# Patient Record
Sex: Female | Born: 1965 | Race: White | Hispanic: No | Marital: Married | Smoking: Former smoker
Health system: Southern US, Community
[De-identification: ages and names within clinical notes are randomized; demographics above are authoritative.]

## PROBLEM LIST (undated history)

## (undated) DIAGNOSIS — F1010 Alcohol abuse, uncomplicated: Secondary | ICD-10-CM

## (undated) DIAGNOSIS — Z5189 Encounter for other specified aftercare: Secondary | ICD-10-CM

## (undated) DIAGNOSIS — F419 Anxiety disorder, unspecified: Secondary | ICD-10-CM

## (undated) DIAGNOSIS — E0590 Thyrotoxicosis, unspecified without thyrotoxic crisis or storm: Secondary | ICD-10-CM

## (undated) DIAGNOSIS — E038 Other specified hypothyroidism: Secondary | ICD-10-CM

## (undated) DIAGNOSIS — E785 Hyperlipidemia, unspecified: Secondary | ICD-10-CM

## (undated) DIAGNOSIS — R011 Cardiac murmur, unspecified: Secondary | ICD-10-CM

## (undated) DIAGNOSIS — D649 Anemia, unspecified: Secondary | ICD-10-CM

## (undated) DIAGNOSIS — F32A Depression, unspecified: Secondary | ICD-10-CM

## (undated) DIAGNOSIS — F329 Major depressive disorder, single episode, unspecified: Secondary | ICD-10-CM

## (undated) HISTORY — DX: Cardiac murmur, unspecified: R01.1

## (undated) HISTORY — PX: WISDOM TOOTH EXTRACTION: SHX21

## (undated) HISTORY — DX: Hyperlipidemia, unspecified: E78.5

## (undated) HISTORY — DX: Major depressive disorder, single episode, unspecified: F32.9

## (undated) HISTORY — PX: ECTOPIC PREGNANCY SURGERY: SHX613

## (undated) HISTORY — DX: Depression, unspecified: F32.A

## (undated) HISTORY — DX: Encounter for other specified aftercare: Z51.89

## (undated) HISTORY — DX: Anxiety disorder, unspecified: F41.9

## (undated) HISTORY — DX: Alcohol abuse, uncomplicated: F10.10

## (undated) HISTORY — DX: Anemia, unspecified: D64.9

## (undated) HISTORY — PX: TUBAL LIGATION: SHX77

---

## 1997-06-20 ENCOUNTER — Other Ambulatory Visit: Admission: RE | Admit: 1997-06-20 | Discharge: 1997-06-20 | Payer: Self-pay | Admitting: Obstetrics and Gynecology

## 1997-10-28 ENCOUNTER — Inpatient Hospital Stay (HOSPITAL_COMMUNITY): Admission: AD | Admit: 1997-10-28 | Discharge: 1997-10-28 | Payer: Self-pay | Admitting: Obstetrics and Gynecology

## 1997-11-21 ENCOUNTER — Encounter (HOSPITAL_COMMUNITY): Admission: RE | Admit: 1997-11-21 | Discharge: 1997-12-30 | Payer: Self-pay | Admitting: Obstetrics and Gynecology

## 1997-12-09 ENCOUNTER — Inpatient Hospital Stay (HOSPITAL_COMMUNITY): Admission: AD | Admit: 1997-12-09 | Discharge: 1997-12-09 | Payer: Self-pay | Admitting: Obstetrics and Gynecology

## 1997-12-10 ENCOUNTER — Ambulatory Visit (HOSPITAL_COMMUNITY): Admission: RE | Admit: 1997-12-10 | Discharge: 1997-12-10 | Payer: Self-pay | Admitting: Obstetrics and Gynecology

## 1997-12-10 ENCOUNTER — Encounter: Payer: Self-pay | Admitting: Obstetrics and Gynecology

## 1997-12-12 ENCOUNTER — Inpatient Hospital Stay (HOSPITAL_COMMUNITY): Admission: AD | Admit: 1997-12-12 | Discharge: 1997-12-12 | Payer: Self-pay | Admitting: Obstetrics & Gynecology

## 1997-12-26 ENCOUNTER — Inpatient Hospital Stay (HOSPITAL_COMMUNITY): Admission: AD | Admit: 1997-12-26 | Discharge: 1997-12-29 | Payer: Self-pay | Admitting: Obstetrics and Gynecology

## 1998-01-28 ENCOUNTER — Other Ambulatory Visit: Admission: RE | Admit: 1998-01-28 | Discharge: 1998-01-28 | Payer: Self-pay | Admitting: Obstetrics and Gynecology

## 1999-02-10 ENCOUNTER — Ambulatory Visit (HOSPITAL_COMMUNITY): Admission: RE | Admit: 1999-02-10 | Discharge: 1999-02-10 | Payer: Self-pay | Admitting: Obstetrics and Gynecology

## 1999-03-26 ENCOUNTER — Other Ambulatory Visit: Admission: RE | Admit: 1999-03-26 | Discharge: 1999-03-26 | Payer: Self-pay | Admitting: Obstetrics and Gynecology

## 2003-05-09 ENCOUNTER — Emergency Department (HOSPITAL_COMMUNITY): Admission: EM | Admit: 2003-05-09 | Discharge: 2003-05-09 | Payer: Self-pay | Admitting: Emergency Medicine

## 2004-05-08 ENCOUNTER — Other Ambulatory Visit: Admission: RE | Admit: 2004-05-08 | Discharge: 2004-05-08 | Payer: Self-pay | Admitting: Obstetrics and Gynecology

## 2004-05-25 ENCOUNTER — Encounter: Admission: RE | Admit: 2004-05-25 | Discharge: 2004-05-25 | Payer: Self-pay | Admitting: Obstetrics and Gynecology

## 2004-12-18 ENCOUNTER — Encounter: Admission: RE | Admit: 2004-12-18 | Discharge: 2004-12-18 | Payer: Self-pay | Admitting: Obstetrics and Gynecology

## 2005-06-07 ENCOUNTER — Encounter: Admission: RE | Admit: 2005-06-07 | Discharge: 2005-06-07 | Payer: Self-pay | Admitting: Obstetrics and Gynecology

## 2010-04-12 ENCOUNTER — Encounter: Payer: Self-pay | Admitting: Obstetrics and Gynecology

## 2010-06-19 ENCOUNTER — Other Ambulatory Visit: Payer: Self-pay | Admitting: Endocrinology

## 2010-06-19 DIAGNOSIS — R1011 Right upper quadrant pain: Secondary | ICD-10-CM

## 2010-06-22 ENCOUNTER — Other Ambulatory Visit: Payer: Self-pay

## 2010-06-24 ENCOUNTER — Ambulatory Visit
Admission: RE | Admit: 2010-06-24 | Discharge: 2010-06-24 | Disposition: A | Discharge status: Home / Self Care | Payer: BC Managed Care – PPO | Source: Ambulatory Visit | Attending: Endocrinology | Admitting: Endocrinology

## 2010-06-24 DIAGNOSIS — R1011 Right upper quadrant pain: Secondary | ICD-10-CM

## 2010-06-24 MED ORDER — IOHEXOL 300 MG/ML  SOLN
100.0000 mL | Freq: Once | INTRAMUSCULAR | Status: AC | PRN
  Administered 2010-06-24: 100 mL via INTRAVENOUS

## 2011-10-01 ENCOUNTER — Encounter: Payer: Self-pay | Admitting: Gastroenterology

## 2011-11-01 ENCOUNTER — Encounter: Payer: Self-pay | Admitting: Gastroenterology

## 2011-11-01 ENCOUNTER — Ambulatory Visit (INDEPENDENT_AMBULATORY_CARE_PROVIDER_SITE_OTHER): Payer: BC Managed Care – PPO | Admitting: Gastroenterology

## 2011-11-01 ENCOUNTER — Other Ambulatory Visit (INDEPENDENT_AMBULATORY_CARE_PROVIDER_SITE_OTHER): Payer: BC Managed Care – PPO

## 2011-11-01 VITALS — BP 146/84 | HR 100 | Ht 61.0 in | Wt 155.6 lb

## 2011-11-01 DIAGNOSIS — K625 Hemorrhage of anus and rectum: Secondary | ICD-10-CM

## 2011-11-01 DIAGNOSIS — R1011 Right upper quadrant pain: Secondary | ICD-10-CM

## 2011-11-01 MED ORDER — MOVIPREP 100 G PO SOLR: 1.0000 | ORAL | Status: DC

## 2011-11-01 NOTE — Progress Notes (Signed)
HPI: This is a   very pleasant 46 year old woman whom I am meeting for the first time today.   With intermittent rectal bleeding, 1.5 years, more frequently lately.  At least once a week now. With BMs.  USually at end of BM, frank blood.  Tissue.  No anal pain, no itching.  No real constipatoin.    Overall stable weight.  No abd pains.  No colon cancer.  Can have brisk gastro-colic reflex.  Also has inttermittent RUQ abd pain. [place] in PCP office was normal from she tells me. CT scan in 2012 abd/pelvis was normal.   She tells that her liver tests all within normal primary care office  She is to drink about 12-24 beers a day, this has cut back down to 6-8 beers a day fairly regularly.   Review of systems: Pertinent positive and negative review of systems were noted in the above HPI section. Complete review of systems was performed and was otherwise normal.    Past Medical History  Diagnosis Date  . Depression   . Alcohol abuse   . Hyperlipidemia   . Hypothyroidism     Past Surgical History  Procedure Date  . Tubal ligation   . Ectopic pregnancy surgery     Current Outpatient Prescriptions  Medication Sig Dispense Refill  . DULoxetine (CYMBALTA) 60 MG capsule Take 60 mg by mouth daily.      Marland Kitchen levothyroxine (SYNTHROID, LEVOTHROID) 175 MCG tablet Take 175 mcg by mouth daily.      . rosuvastatin (CRESTOR) 20 MG tablet Take 20 mg by mouth daily.        Allergies as of 11/01/2011 - Review Complete 11/01/2011  Allergen Reaction Noted  . Codeine  11/01/2011    Family History  Problem Relation Age of Onset  . Colon polyps Father   . Heart disease Father   . Breast cancer Sister   . Crohn's disease Sister   . Ovarian cancer Maternal Aunt   . Diabetes Maternal Grandmother   . Diabetes Maternal Grandfather   . Diabetes Paternal Grandmother   . Diabetes Paternal Grandfather   . Thyroid disease Mother     History   Social History  . Marital Status: Married    Spouse Name:  N/A    Number of Children: 3  . Years of Education: N/A   Occupational History  . Customer Service    Social History Main Topics  . Smoking status: Former Games developer  . Smokeless tobacco: Never Used  . Alcohol Use: Yes  . Drug Use: No  . Sexually Active: Not on file   Other Topics Concern  . Not on file   Social History Narrative  . No narrative on file       Physical Exam: BP 146/84  Pulse 100  Ht 5\' 1"  (1.549 m)  Wt 155 lb 9.6 oz (70.58 kg)  BMI 29.40 kg/m2  LMP 11/01/2011 Constitutional: generally well-appearing Psychiatric: alert and oriented x3 Eyes: extraocular movements intact Mouth: oral pharynx moist, no lesions Neck: supple no lymphadenopathy Cardiovascular: heart regular rate and rhythm Lungs: clear to auscultation bilaterally Abdomen: soft, nontender, nondistended, no obvious ascites, no peritoneal signs, normal bowel sounds Extremities: no lower extremity edema bilaterally Skin: no lesions on visible extremities Rectal examination with female assistant in the room: Medium sized external anal hemorrhoids without any thrombosis, no internal masses, stool is brown and not check for Hemoccult blood   Assessment and plan: 46 y.o. female with  external hemorrhoids, rectal  bleeding, intermittent abdominal pains, high alcohol intake daily  We need to proceed with colonoscopy to make sure that nothing else is missed, contributing to her intermittent rectal bleeding such as rectal tumors, sigmoid or other colon cancers. She drinks quite a lot of alcohol and recommended she try to cut back even further. Suspicious that she might have alcohol related gastritis as the source of her intermittent abdominal pains are likely with EGD at the same time as her colonoscopy. These should be done with propofol sedation given her alcohol abuse.

## 2011-11-01 NOTE — Patient Instructions (Addendum)
You will have labs checked today in the basement lab.  Please head down after you check out with the front desk  (cbc). You will be set up for a colonoscopy for rectal bleeding with MAC sedation given high alcohol intake. You will be set up for an upper endoscopy for RUQ pains at same time as colonoscopy. You probably drink too much alcohol daily, should try cutting back even more.

## 2011-11-02 ENCOUNTER — Encounter: Payer: Self-pay | Admitting: Gastroenterology

## 2011-11-02 LAB — CBC WITH DIFFERENTIAL/PLATELET
Basophils Absolute: 0.1 10*3/uL (ref 0.0–0.1)
Eosinophils Absolute: 0.1 10*3/uL (ref 0.0–0.7)
Eosinophils Relative: 0.8 % (ref 0.0–5.0)
HCT: 41 % (ref 36.0–46.0)
Lymphs Abs: 1.5 10*3/uL (ref 0.7–4.0)
MCV: 89.5 fl (ref 78.0–100.0)
Monocytes Absolute: 0.7 10*3/uL (ref 0.1–1.0)
Neutrophils Relative %: 71.6 % (ref 43.0–77.0)
Platelets: 290 10*3/uL (ref 150.0–400.0)
RDW: 14.5 % (ref 11.5–14.6)
WBC: 8.2 10*3/uL (ref 4.5–10.5)

## 2011-11-03 ENCOUNTER — Telehealth: Payer: Self-pay | Admitting: Gastroenterology

## 2011-11-04 MED ORDER — PRAMOXINE-HC 1-2.5 % EX CREA: TOPICAL_CREAM | CUTANEOUS | Status: DC

## 2011-11-04 NOTE — Telephone Encounter (Signed)
Patient reports that her hemorrhoids are now bothering her itching and burning.  Can we send in some anusol cream or suppositories?

## 2011-11-04 NOTE — Telephone Encounter (Signed)
Yes, analpram ointment, apply twice daily as needed.  Disp one month with 2 refills.  If she is not already on fiber supplements she should be taking them daily.  Thanks

## 2011-11-04 NOTE — Telephone Encounter (Signed)
Patient advised.

## 2011-11-12 ENCOUNTER — Encounter: Payer: Self-pay | Admitting: Gastroenterology

## 2011-11-12 ENCOUNTER — Ambulatory Visit (AMBULATORY_SURGERY_CENTER): Payer: BC Managed Care – PPO | Admitting: Gastroenterology

## 2011-11-12 VITALS — BP 152/76 | HR 104 | Temp 98.7°F | Resp 20 | Ht 61.0 in | Wt 155.0 lb

## 2011-11-12 DIAGNOSIS — K2970 Gastritis, unspecified, without bleeding: Secondary | ICD-10-CM

## 2011-11-12 DIAGNOSIS — R1011 Right upper quadrant pain: Secondary | ICD-10-CM

## 2011-11-12 DIAGNOSIS — K625 Hemorrhage of anus and rectum: Secondary | ICD-10-CM

## 2011-11-12 DIAGNOSIS — K2990 Gastroduodenitis, unspecified, without bleeding: Secondary | ICD-10-CM

## 2011-11-12 DIAGNOSIS — K649 Unspecified hemorrhoids: Secondary | ICD-10-CM

## 2011-11-12 DIAGNOSIS — D126 Benign neoplasm of colon, unspecified: Secondary | ICD-10-CM

## 2011-11-12 MED ORDER — SODIUM CHLORIDE 0.9 % IV SOLN: 500.0000 mL | INTRAVENOUS | Status: DC

## 2011-11-12 MED ORDER — HYDROCORTISONE ACETATE 25 MG RE SUPP: 25.0000 mg | Freq: Two times a day (BID) | RECTAL | Status: AC | PRN

## 2011-11-12 NOTE — Patient Instructions (Addendum)
Discharge instructions given with verbal understanding. Handouts on polyps and hemorrhoids given. Resume previous medications. YOU HAD AN ENDOSCOPIC PROCEDURE TODAY AT THE [facility]: Refer to the procedure report that was given to you for any specific questions about what was found during the examination.  If the procedure report does not answer your questions, please call your gastroenterologist to clarify.  If you requested that your care partner not be given the details of your procedure findings, then the procedure report has been included in a sealed envelope for you to review at your convenience later.  YOU SHOULD EXPECT: Some feelings of bloating in the abdomen. Passage of more gas than usual.  Walking can help get rid of the air that was put into your GI tract during the procedure and reduce the bloating. If you had a lower endoscopy (such as a colonoscopy or flexible sigmoidoscopy) you may notice spotting of blood in your stool or on the toilet paper. If you underwent a bowel prep for your procedure, then you may not have a normal bowel movement for a few days.  DIET: Your first meal following the procedure should be a light meal and then it is ok to progress to your normal diet.  A half-sandwich or bowl of soup is an example of a good first meal.  Heavy or fried foods are harder to digest and may make you feel nauseous or bloated.  Likewise meals heavy in dairy and vegetables can cause extra gas to form and this can also increase the bloating.  Drink plenty of fluids but you should avoid alcoholic beverages for 24 hours.  ACTIVITY: Your care partner should take you home directly after the procedure.  You should plan to take it easy, moving slowly for the rest of the day.  You can resume normal activity the day after the procedure however you should NOT DRIVE or use heavy machinery for 24 hours (because of the sedation medicines used during the test).    SYMPTOMS TO REPORT  IMMEDIATELY: A gastroenterologist can be reached at any hour.  During normal business hours, 8:30 AM to 5:00 PM Monday through Friday, call [phone].  After hours and on weekends, please call the GI answering service at [phone] who will take a message and have the physician on call contact you.   Following lower endoscopy (colonoscopy or flexible sigmoidoscopy):  Excessive amounts of blood in the stool  Significant tenderness or worsening of abdominal pains  Swelling of the abdomen that is new, acute  Fever of 100F or higher  Following upper endoscopy (EGD)  Vomiting of blood or coffee ground material  New chest pain or pain under the shoulder blades  Painful or persistently difficult swallowing  New shortness of breath  Fever of 100F or higher  Black, tarry-looking stools  FOLLOW UP: If any biopsies were taken you will be contacted by phone or by letter within the next 1-3 weeks.  Call your gastroenterologist if you have not heard about the biopsies in 3 weeks.  Our staff will call the home number listed on your records the next business day following your procedure to check on you and address any questions or concerns that you may have at that time regarding the information given to you following your procedure. This is a courtesy call and so if there is no answer at the home number and we have not heard from you through the emergency physician on call, we will assume that you   have returned to your regular daily activities without incident.  SIGNATURES/CONFIDENTIALITY: You and/or your care partner have signed paperwork which will be entered into your electronic medical record.  These signatures attest to the fact that that the information above on your After Visit Summary has been reviewed and is understood.  Full responsibility of the confidentiality of this discharge information lies with you and/or your care-partner.  

## 2011-11-12 NOTE — Op Note (Signed)
[facility] [address] [place], [address]   COLONOSCOPY PROCEDURE REPORT  PATIENT: Jill Olsen, Jill Olsen  MR#: [id number] BIRTHDATE: Apr 17, 1965 , 45  yrs. old GENDER: Female ENDOSCOPIST: Rachael Fee, MD REFERRED [id number] Evlyn Kanner, M.D. PROCEDURE DATE:  11/12/2011 PROCEDURE:   Colonoscopy with snare polypectomy ASA CLASS:   Class II INDICATIONS:minor rectal bleeding. MEDICATIONS: propofol (Diprivan) 300mg  IV  DESCRIPTION OF PROCEDURE:   After the risks benefits and alternatives of the procedure were thoroughly explained, informed consent was obtained.  A digital rectal exam revealed no rectal mass.   The Pentax Ped Colon [id number]  endoscope was introduced through the anus and advanced to the cecum, which was identified by both the appendix and ileocecal valve. No adverse events experienced.   The quality of the prep was good.  The instrument was then slowly withdrawn as the colon was fully examined.    COLON FINDINGS: A smooth sessile polyp ranging between 3-55mm in size was found in the sigmoid colon.  A polypectomy was performed using snare cautery.  The resection was complete and the polyp tissue was completely retrieved.   External hemorrhoids were found.   The colon mucosa was otherwise normal.  Retroflexed views revealed no abnormalities. The time to cecum=2 minutes 37 seconds  Withdrawal time=9 minutes 58 seconds.  The scope was withdrawn and the procedure completed.  COMPLICATIONS: There were no complications. ENDOSCOPIC IMPRESSION: 1.   Single small polyp was found, removed and sent to pathology 2.   External hemorrhoids 3.   The colon mucosa was otherwise normal  RECOMMENDATIONS: If the polyp(s) removed today are proven to be adenomatous (pre-cancerous) polyps, you will need a repeat colonoscopy in 5 years.  Otherwise you should continue to follow colorectal cancer screening guidelines for "routine risk" patients with colonoscopy in 10 years.   You will receive a letter within 1-2 weeks with the results of your biopsy as well as final recommendations.  Please call my office if you have not received a letter after 3 weeks.  eSigned:  Rachael Fee, MD 11/12/2011 10:17 AM

## 2011-11-12 NOTE — Op Note (Signed)
[facility] [address] [place], [address]   ENDOSCOPY PROCEDURE REPORT  PATIENT: Jill Olsen, Jill Olsen  MR#: [id number] BIRTHDATE: November 26, 1965 , 45  yrs. old GENDER: Female ENDOSCOPIST: Rachael Fee, MD REFERRED BY:  Adrian Prince, M.D. PROCEDURE DATE:  11/12/2011 PROCEDURE:  EGD w/ biopsy ASA CLASS:     Class II INDICATIONS:  abdominal pain in the upper right quadrant. MEDICATIONS: Propofol (Diprivan) 170 mg IV TOPICAL ANESTHETIC: none  DESCRIPTION OF PROCEDURE: After the risks benefits and alternatives of the procedure were thoroughly explained, informed consent was obtained.  The [id number] ([id number]) endoscope was introduced through the mouth and advanced to the second portion of the duodenum. Without limitations.  The instrument was slowly withdrawn as the mucosa was fully examined.     Moderate gastritis (inflammation) was found in the gastric antrum. A biopsy was performed using cold forceps.  The remainder of the upper endoscopy exam was otherwise normal.  Retroflexed views revealed no abnormalities.     The scope was then withdrawn from the patient and the procedure completed.  COMPLICATIONS: There were no complications.  ENDOSCOPIC IMPRESSION: 1.   Moderate gastritis was found and biopsied to check for H. pylori.  Alcohol can cause similar finding 2.   The remainder of the upper endoscopy exam was otherwise normal  RECOMMENDATIONS: Await biopsy results Continue to try to cut back on alcohol, it is probably causing or at least contributing to your GI symptoms     eSigned:  Rachael Fee, MD 11/12/2011 10:28 AM

## 2011-11-15 ENCOUNTER — Telehealth: Payer: Self-pay

## 2011-11-15 NOTE — Telephone Encounter (Signed)
Left message on answering machine. 

## 2011-11-19 ENCOUNTER — Encounter: Payer: Self-pay | Admitting: Gastroenterology

## 2013-02-12 ENCOUNTER — Observation Stay (HOSPITAL_COMMUNITY): Payer: 59 | Admitting: Certified Registered Nurse Anesthetist

## 2013-02-12 ENCOUNTER — Observation Stay (HOSPITAL_COMMUNITY)
Admission: EM | Admit: 2013-02-12 | Discharge: 2013-02-13 | Disposition: A | Discharge status: Home / Self Care | Payer: 59 | Attending: General Surgery | Admitting: General Surgery

## 2013-02-12 ENCOUNTER — Encounter (HOSPITAL_COMMUNITY): Payer: 59 | Admitting: Certified Registered Nurse Anesthetist

## 2013-02-12 ENCOUNTER — Emergency Department (HOSPITAL_COMMUNITY): Payer: 59

## 2013-02-12 ENCOUNTER — Encounter (HOSPITAL_COMMUNITY): Payer: Self-pay | Admitting: Emergency Medicine

## 2013-02-12 ENCOUNTER — Encounter (HOSPITAL_COMMUNITY)
Admission: EM | Disposition: A | Discharge status: Home / Self Care | Payer: Self-pay | Source: Home / Self Care | Attending: Emergency Medicine

## 2013-02-12 DIAGNOSIS — R112 Nausea with vomiting, unspecified: Secondary | ICD-10-CM | POA: Insufficient documentation

## 2013-02-12 DIAGNOSIS — F1010 Alcohol abuse, uncomplicated: Secondary | ICD-10-CM | POA: Insufficient documentation

## 2013-02-12 DIAGNOSIS — K3580 Unspecified acute appendicitis: Secondary | ICD-10-CM

## 2013-02-12 DIAGNOSIS — K37 Unspecified appendicitis: Secondary | ICD-10-CM

## 2013-02-12 DIAGNOSIS — Z885 Allergy status to narcotic agent status: Secondary | ICD-10-CM | POA: Insufficient documentation

## 2013-02-12 DIAGNOSIS — Z87891 Personal history of nicotine dependence: Secondary | ICD-10-CM | POA: Insufficient documentation

## 2013-02-12 DIAGNOSIS — E0590 Thyrotoxicosis, unspecified without thyrotoxic crisis or storm: Secondary | ICD-10-CM | POA: Insufficient documentation

## 2013-02-12 DIAGNOSIS — E038 Other specified hypothyroidism: Secondary | ICD-10-CM | POA: Insufficient documentation

## 2013-02-12 DIAGNOSIS — F329 Major depressive disorder, single episode, unspecified: Secondary | ICD-10-CM | POA: Insufficient documentation

## 2013-02-12 DIAGNOSIS — E039 Hypothyroidism, unspecified: Secondary | ICD-10-CM

## 2013-02-12 DIAGNOSIS — E785 Hyperlipidemia, unspecified: Secondary | ICD-10-CM | POA: Insufficient documentation

## 2013-02-12 DIAGNOSIS — F3289 Other specified depressive episodes: Secondary | ICD-10-CM | POA: Insufficient documentation

## 2013-02-12 HISTORY — DX: Thyrotoxicosis, unspecified without thyrotoxic crisis or storm: E05.90

## 2013-02-12 HISTORY — PX: LAPAROSCOPIC APPENDECTOMY: SHX408

## 2013-02-12 HISTORY — DX: Other specified hypothyroidism: E03.8

## 2013-02-12 LAB — COMPREHENSIVE METABOLIC PANEL
Alkaline Phosphatase: 114 U/L (ref 39–117)
GFR calc Af Amer: >90 mL/min (ref >90)
Sodium: 135 mEq/L (ref 135–145)
Total Bilirubin: 0.4 mg/dL (ref 0.3–1.2)

## 2013-02-12 LAB — CBC WITH DIFFERENTIAL/PLATELET
Basophils Absolute: 0 10*3/uL (ref 0.0–0.1)
Basophils Relative: 0 % (ref 0–1)
Eosinophils Relative: 0 % (ref 0–5)
Lymphocytes Relative: 5 % — ABNORMAL LOW (ref 12–46)
Neutro Abs: 14.2 10*3/uL — ABNORMAL HIGH (ref 1.7–7.7)
Platelets: 275 10*3/uL (ref 150–400)
RDW: 16.1 % — ABNORMAL HIGH (ref 11.5–15.5)
WBC: 16.1 10*3/uL — ABNORMAL HIGH (ref 4.0–10.5)

## 2013-02-12 LAB — URINALYSIS, ROUTINE W REFLEX MICROSCOPIC
Bilirubin Urine: NEGATIVE
Glucose, UA: NEGATIVE mg/dL
Leukocytes, UA: NEGATIVE
Nitrite: NEGATIVE
Specific Gravity, Urine: 1.009 (ref 1.005–1.030)
Urobilinogen, UA: 0.2 mg/dL (ref 0.0–1.0)
pH: 6 (ref 5.0–8.0)

## 2013-02-12 LAB — POCT PREGNANCY, URINE: Preg Test, Ur: NEGATIVE

## 2013-02-12 LAB — URINE MICROSCOPIC-ADD ON

## 2013-02-12 LAB — LIPASE, BLOOD: Lipase: 36 U/L (ref 11–59)

## 2013-02-12 SURGERY — APPENDECTOMY, LAPAROSCOPIC
Anesthesia: General | Site: Abdomen | Wound class: Contaminated

## 2013-02-12 MED ORDER — BUPIVACAINE-EPINEPHRINE 0.25% -1:200000 IJ SOLN
INTRAMUSCULAR | Status: DC | PRN
  Administered 2013-02-12: 16 mL

## 2013-02-12 MED ORDER — NEOSTIGMINE METHYLSULFATE 1 MG/ML IJ SOLN
INTRAMUSCULAR | Status: DC | PRN
  Administered 2013-02-12: 4 mg via INTRAVENOUS

## 2013-02-12 MED ORDER — HYDROCODONE-ACETAMINOPHEN 5-325 MG PO TABS
1.0000 | ORAL_TABLET | ORAL | Status: DC | PRN
  Administered 2013-02-12: 1 via ORAL
  Administered 2013-02-12 – 2013-02-13 (×2): 2 via ORAL
  Filled 2013-02-12: qty 1
  Filled 2013-02-12 (×2): qty 2

## 2013-02-12 MED ORDER — BUPIVACAINE-EPINEPHRINE (PF) 0.25% -1:200000 IJ SOLN
INTRAMUSCULAR | Status: AC
  Filled 2013-02-12: qty 30

## 2013-02-12 MED ORDER — DIPHENHYDRAMINE HCL 50 MG/ML IJ SOLN
25.0000 mg | Freq: Once | INTRAMUSCULAR | Status: AC
  Administered 2013-02-12: 25 mg via INTRAVENOUS
  Filled 2013-02-12: qty 1

## 2013-02-12 MED ORDER — VITAMIN B-1 100 MG PO TABS
100.0000 mg | ORAL_TABLET | Freq: Every day | ORAL | Status: DC
  Administered 2013-02-13: 100 mg via ORAL
  Filled 2013-02-12 (×2): qty 1

## 2013-02-12 MED ORDER — HYDROMORPHONE HCL PF 1 MG/ML IJ SOLN: 1.0000 mg | INTRAMUSCULAR | Status: DC | PRN

## 2013-02-12 MED ORDER — MORPHINE SULFATE 2 MG/ML IJ SOLN
INTRAMUSCULAR | Status: AC
  Administered 2013-02-12: 2 mg via INTRAVENOUS
  Filled 2013-02-12: qty 1

## 2013-02-12 MED ORDER — LORAZEPAM 1 MG PO TABS: 1.0000 mg | ORAL_TABLET | Freq: Four times a day (QID) | ORAL | Status: DC | PRN

## 2013-02-12 MED ORDER — PIPERACILLIN-TAZOBACTAM 3.375 G IVPB
3.3750 g | Freq: Once | INTRAVENOUS | Status: AC
  Administered 2013-02-12: 3.375 g via INTRAVENOUS
  Filled 2013-02-12: qty 50

## 2013-02-12 MED ORDER — ENOXAPARIN SODIUM 40 MG/0.4ML ~~LOC~~ SOLN
40.0000 mg | SUBCUTANEOUS | Status: DC
  Administered 2013-02-12: 40 mg via SUBCUTANEOUS
  Filled 2013-02-12 (×2): qty 0.4

## 2013-02-12 MED ORDER — PIPERACILLIN-TAZOBACTAM 3.375 G IVPB
3.3750 g | Freq: Three times a day (TID) | INTRAVENOUS | Status: DC
  Administered 2013-02-12 – 2013-02-13 (×2): 3.375 g via INTRAVENOUS
  Filled 2013-02-12 (×4): qty 50

## 2013-02-12 MED ORDER — SODIUM CHLORIDE 0.9 % IR SOLN
Status: DC | PRN
  Administered 2013-02-12: 1000 mL

## 2013-02-12 MED ORDER — FENTANYL CITRATE 0.05 MG/ML IJ SOLN
50.0000 ug | Freq: Once | INTRAMUSCULAR | Status: AC
  Administered 2013-02-12: 50 ug via INTRAVENOUS

## 2013-02-12 MED ORDER — FOLIC ACID 1 MG PO TABS
1.0000 mg | ORAL_TABLET | Freq: Every day | ORAL | Status: DC
  Administered 2013-02-13: 1 mg via ORAL
  Filled 2013-02-12 (×2): qty 1

## 2013-02-12 MED ORDER — MORPHINE SULFATE 2 MG/ML IJ SOLN
2.0000 mg | INTRAMUSCULAR | Status: DC | PRN
  Administered 2013-02-13: 2 mg via INTRAVENOUS
  Filled 2013-02-12: qty 1

## 2013-02-12 MED ORDER — MIDAZOLAM HCL 5 MG/5ML IJ SOLN
INTRAMUSCULAR | Status: DC | PRN
  Administered 2013-02-12: 2 mg via INTRAVENOUS

## 2013-02-12 MED ORDER — SUCCINYLCHOLINE CHLORIDE 20 MG/ML IJ SOLN
INTRAMUSCULAR | Status: DC | PRN
  Administered 2013-02-12: 100 mg via INTRAVENOUS

## 2013-02-12 MED ORDER — MEPERIDINE HCL 25 MG/ML IJ SOLN: 6.2500 mg | INTRAMUSCULAR | Status: DC | PRN

## 2013-02-12 MED ORDER — IOHEXOL 300 MG/ML  SOLN
25.0000 mL | Freq: Once | INTRAMUSCULAR | Status: AC | PRN
  Administered 2013-02-12: 25 mL via ORAL

## 2013-02-12 MED ORDER — MORPHINE SULFATE 2 MG/ML IJ SOLN
1.0000 mg | INTRAMUSCULAR | Status: DC | PRN
  Administered 2013-02-12 (×2): 2 mg via INTRAVENOUS

## 2013-02-12 MED ORDER — LORAZEPAM 2 MG/ML IJ SOLN: 1.0000 mg | Freq: Four times a day (QID) | INTRAMUSCULAR | Status: DC | PRN

## 2013-02-12 MED ORDER — PROPOFOL 10 MG/ML IV BOLUS
INTRAVENOUS | Status: DC | PRN
  Administered 2013-02-12: 100 mg via INTRAVENOUS

## 2013-02-12 MED ORDER — 0.9 % SODIUM CHLORIDE (POUR BTL) OPTIME
TOPICAL | Status: DC | PRN
  Administered 2013-02-12: 1000 mL

## 2013-02-12 MED ORDER — ROCURONIUM BROMIDE 100 MG/10ML IV SOLN
INTRAVENOUS | Status: DC | PRN
  Administered 2013-02-12: 40 mg via INTRAVENOUS

## 2013-02-12 MED ORDER — FENTANYL CITRATE 0.05 MG/ML IJ SOLN
INTRAMUSCULAR | Status: DC | PRN
  Administered 2013-02-12: 50 ug via INTRAVENOUS
  Administered 2013-02-12: 100 ug via INTRAVENOUS

## 2013-02-12 MED ORDER — DIPHENHYDRAMINE HCL 50 MG/ML IJ SOLN
25.0000 mg | Freq: Four times a day (QID) | INTRAMUSCULAR | Status: DC | PRN
Start: 2013-02-12 — End: 2013-02-13
  Administered 2013-02-12 – 2013-02-13 (×2): 25 mg via INTRAVENOUS
  Filled 2013-02-12 (×2): qty 1

## 2013-02-12 MED ORDER — ONDANSETRON HCL 4 MG/2ML IJ SOLN
4.0000 mg | Freq: Four times a day (QID) | INTRAMUSCULAR | Status: DC
  Administered 2013-02-12 – 2013-02-13 (×3): 4 mg via INTRAVENOUS
  Filled 2013-02-12 (×4): qty 2

## 2013-02-12 MED ORDER — THIAMINE HCL 100 MG/ML IJ SOLN
100.0000 mg | Freq: Every day | INTRAMUSCULAR | Status: DC
  Filled 2013-02-12: qty 1

## 2013-02-12 MED ORDER — LIDOCAINE HCL (CARDIAC) 20 MG/ML IV SOLN
INTRAVENOUS | Status: DC | PRN
  Administered 2013-02-12: 100 mg via INTRAVENOUS

## 2013-02-12 MED ORDER — GLYCOPYRROLATE 0.2 MG/ML IJ SOLN
INTRAMUSCULAR | Status: DC | PRN
  Administered 2013-02-12: 0.6 mg via INTRAVENOUS

## 2013-02-12 MED ORDER — ONDANSETRON HCL 4 MG/2ML IJ SOLN: 4.0000 mg | Freq: Once | INTRAMUSCULAR | Status: DC | PRN

## 2013-02-12 MED ORDER — HYDROMORPHONE HCL PF 1 MG/ML IJ SOLN
1.0000 mg | Freq: Once | INTRAMUSCULAR | Status: AC
  Administered 2013-02-12: 1 mg via INTRAVENOUS
  Filled 2013-02-12: qty 1

## 2013-02-12 MED ORDER — SODIUM CHLORIDE 0.9 % IV SOLN
INTRAVENOUS | Status: DC
  Administered 2013-02-12 (×2): via INTRAVENOUS

## 2013-02-12 MED ORDER — ONDANSETRON HCL 4 MG/2ML IJ SOLN
4.0000 mg | Freq: Once | INTRAMUSCULAR | Status: AC
  Administered 2013-02-12: 4 mg via INTRAVENOUS
  Filled 2013-02-12: qty 2

## 2013-02-12 MED ORDER — ONDANSETRON HCL 4 MG/2ML IJ SOLN
INTRAMUSCULAR | Status: DC | PRN
  Administered 2013-02-12: 4 mg via INTRAVENOUS

## 2013-02-12 MED ORDER — IOHEXOL 300 MG/ML  SOLN
100.0000 mL | Freq: Once | INTRAMUSCULAR | Status: AC | PRN
  Administered 2013-02-12: 100 mL via INTRAVENOUS

## 2013-02-12 MED ORDER — ADULT MULTIVITAMIN W/MINERALS CH
1.0000 | ORAL_TABLET | Freq: Every day | ORAL | Status: DC
  Administered 2013-02-13: 1 via ORAL
  Filled 2013-02-12 (×2): qty 1

## 2013-02-12 MED ORDER — LACTATED RINGERS IV SOLN
INTRAVENOUS | Status: DC
  Administered 2013-02-12: 12:00:00 via INTRAVENOUS

## 2013-02-12 SURGICAL SUPPLY — 52 items
ADH SKN CLS APL DERMABOND .7 (GAUZE/BANDAGES/DRESSINGS) ×1
APPLIER CLIP ROT 10 11.4 M/L (STAPLE)
APR CLP MED LRG 11.4X10 (STAPLE)
BAG SPEC RTRVL LRG 6X4 10 (ENDOMECHANICALS) ×1
BLADE SURG ROTATE 9660 (MISCELLANEOUS) ×1 IMPLANT
CANISTER SUCTION 2500CC (MISCELLANEOUS) ×2 IMPLANT
CHLORAPREP W/TINT 26ML (MISCELLANEOUS) ×2 IMPLANT
CLIP APPLIE ROT 10 11.4 M/L (STAPLE) IMPLANT
COVER SURGICAL LIGHT HANDLE (MISCELLANEOUS) ×2 IMPLANT
CUTTER LINEAR ENDO 35 ETS (STAPLE) ×1 IMPLANT
CUTTER LINEAR ENDO 35 ETS TH (STAPLE) IMPLANT
DECANTER SPIKE VIAL GLASS SM (MISCELLANEOUS) ×2 IMPLANT
DERMABOND ADVANCED (GAUZE/BANDAGES/DRESSINGS) ×1
DERMABOND ADVANCED .7 DNX12 (GAUZE/BANDAGES/DRESSINGS) ×1 IMPLANT
DRAPE UTILITY 15X26 W/TAPE STR (DRAPE) ×4 IMPLANT
ELECT REM PT RETURN 9FT ADLT (ELECTROSURGICAL) ×2
ELECTRODE REM PT RTRN 9FT ADLT (ELECTROSURGICAL) ×1 IMPLANT
ENDOLOOP SUT PDS II  0 18 (SUTURE)
ENDOLOOP SUT PDS II 0 18 (SUTURE) IMPLANT
GLOVE BIO SURGEON STRL SZ7.5 (GLOVE) ×1 IMPLANT
GLOVE BIOGEL PI IND STRL 6.5 (GLOVE) IMPLANT
GLOVE BIOGEL PI IND STRL 7.0 (GLOVE) IMPLANT
GLOVE BIOGEL PI IND STRL 7.5 (GLOVE) IMPLANT
GLOVE BIOGEL PI IND STRL 8 (GLOVE) ×1 IMPLANT
GLOVE BIOGEL PI INDICATOR 6.5 (GLOVE) ×2
GLOVE BIOGEL PI INDICATOR 7.0 (GLOVE) ×2
GLOVE BIOGEL PI INDICATOR 7.5 (GLOVE) ×1
GLOVE BIOGEL PI INDICATOR 8 (GLOVE) ×1
GLOVE ECLIPSE 7.5 STRL STRAW (GLOVE) ×2 IMPLANT
GLOVE SS BIOGEL STRL SZ 6.5 (GLOVE) IMPLANT
GLOVE SUPERSENSE BIOGEL SZ 6.5 (GLOVE) ×1
GOWN STRL NON-REIN LRG LVL3 (GOWN DISPOSABLE) ×5 IMPLANT
KIT BASIN OR (CUSTOM PROCEDURE TRAY) ×2 IMPLANT
KIT ROOM TURNOVER OR (KITS) ×2 IMPLANT
NS IRRIG 1000ML POUR BTL (IV SOLUTION) ×2 IMPLANT
PAD ARMBOARD 7.5X6 YLW CONV (MISCELLANEOUS) ×4 IMPLANT
PENCIL BUTTON HOLSTER BLD 10FT (ELECTRODE) IMPLANT
POUCH SPECIMEN RETRIEVAL 10MM (ENDOMECHANICALS) ×2 IMPLANT
RELOAD /EVU35 (ENDOMECHANICALS) ×1 IMPLANT
RELOAD CUTTER ETS 35MM STAND (ENDOMECHANICALS) IMPLANT
SET IRRIG TUBING LAPAROSCOPIC (IRRIGATION / IRRIGATOR) ×2 IMPLANT
SPECIMEN JAR SMALL (MISCELLANEOUS) ×2 IMPLANT
SUT MNCRL AB 4-0 PS2 18 (SUTURE) ×2 IMPLANT
TOWEL OR 17X24 6PK STRL BLUE (TOWEL DISPOSABLE) ×2 IMPLANT
TOWEL OR 17X26 10 PK STRL BLUE (TOWEL DISPOSABLE) ×2 IMPLANT
TOWEL OR NON WOVEN STRL DISP B (DISPOSABLE) ×1 IMPLANT
TRAY FOLEY CATH 16FR SILVER (SET/KITS/TRAYS/PACK) ×2 IMPLANT
TRAY LAPAROSCOPIC (CUSTOM PROCEDURE TRAY) ×2 IMPLANT
TROCAR XCEL 12X100 BLDLESS (ENDOMECHANICALS) ×2 IMPLANT
TROCAR XCEL BLUNT TIP 100MML (ENDOMECHANICALS) ×2 IMPLANT
TROCAR XCEL NON-BLD 5MMX100MML (ENDOMECHANICALS) ×2 IMPLANT
WATER STERILE IRR 1000ML POUR (IV SOLUTION) IMPLANT

## 2013-02-12 NOTE — ED Notes (Addendum)
Presents with generalized abdominal pain began at 10 pm associated with nausea and vomiting. Pain rated 7/10 described as dull constant ache began after eating chicken and dumplings. Pain is more on right side than left. Putting legs straight makes pain worse and lying on left side makes pain worse. Nothing makes pain better. Denies SOB.  Abdomen nontender to palpation.

## 2013-02-12 NOTE — Anesthesia Postprocedure Evaluation (Signed)
  Anesthesia Post-op Note  Patient: Jill Olsen  Procedure(s) Performed: Procedure(s): APPENDECTOMY LAPAROSCOPIC (N/A)  Patient Location: PACU  Anesthesia Type:General  Level of Consciousness: awake, alert  and oriented  Airway and Oxygen Therapy: Patient Spontanous Breathing and Patient connected to nasal cannula oxygen  Post-op Pain: none  Post-op Assessment: Post-op Vital signs reviewed, Patient's Cardiovascular Status Stable, Respiratory Function Stable, Patent Airway, No signs of Nausea or vomiting and Pain level controlled  Post-op Vital Signs: stable  Complications: No apparent anesthesia complications

## 2013-02-12 NOTE — ED Provider Notes (Signed)
CSN: [id number]     Arrival date & time 02/12/13  0250 History   First MD Initiated Contact with Patient 02/12/13 0459     Chief Complaint  Patient presents with  . Abdominal Pain   (Consider location/radiation/quality/duration/timing/severity/associated sxs/prior Treatment) HPI 47 year old female presents to emergency room home with complaint of diffuse abdominal pain starting at 10 PM with nausea and vomiting.  Pain is described as a dull ache.  Patient ate chicken and dumplings for dinner prior to symptoms.  The family also ate this and no one else is ill.  No fevers or chills.  No urinary symptoms.  Patient has chronic right upper quadrant pain ongoing for some time.  She reports negative workup thus far, with specialized scans, and ultrasound.  She reports she is having new pain to her lower cord, or it's.  No vaginal discharge.  No diarrhea. Past Medical History  Diagnosis Date  . Depression   . Alcohol abuse   . Hyperlipidemia   . Hypothyroidism    Past Surgical History  Procedure Laterality Date  . Tubal ligation    . Ectopic pregnancy surgery     Family History  Problem Relation Age of Onset  . Colon polyps Father   . Heart disease Father   . Breast cancer Sister   . Crohn's disease Sister   . Ovarian cancer Maternal Aunt   . Thyroid disease Mother    History  Substance Use Topics  . Smoking status: Former Games developer  . Smokeless tobacco: Never Used  . Alcohol Use: Yes   OB History   Grav Para Term Preterm Abortions TAB SAB Ect Mult Living                 Review of Systems  All other systems reviewed and are negative.    Allergies  Codeine  Home Medications   Current Outpatient Rx  Name  Route  Sig  Dispense  Refill  . DULoxetine (CYMBALTA) 60 MG capsule   Oral   Take 60 mg by mouth daily.         Marland Kitchen ibuprofen (ADVIL,MOTRIN) 200 MG tablet   Oral   Take 800 mg by mouth every 6 (six) hours as needed for moderate pain.         Marland Kitchen levothyroxine  (SYNTHROID, LEVOTHROID) 175 MCG tablet   Oral   Take 175 mcg by mouth daily.         . rosuvastatin (CRESTOR) 20 MG tablet   Oral   Take 20 mg by mouth daily.          BP 160/88  Pulse 110  Temp(Src) 98.6 F (37 C) (Oral)  Resp 18  Wt 155 lb 14.4 oz (70.716 kg)  SpO2 99% Physical Exam  Nursing note and vitals reviewed. Constitutional: She is oriented to person, place, and time. She appears well-developed and well-nourished. She appears distressed.  HENT:  Head: Normocephalic and atraumatic.  Nose: Nose normal.  Mouth/Throat: Oropharynx is clear and moist.  Eyes: Conjunctivae and EOM are normal. Pupils are equal, round, and reactive to light.  Neck: Normal range of motion. Neck supple. No JVD present. No tracheal deviation present. No thyromegaly present.  Cardiovascular: Normal rate, regular rhythm, normal heart sounds and intact distal pulses.  Exam reveals no gallop and no friction rub.   No murmur heard. Pulmonary/Chest: Effort normal and breath sounds normal. No stridor. No respiratory distress. She has no wheezes. She has no rales. She exhibits no tenderness.  Abdominal: Soft. Bowel sounds are normal. She exhibits no distension and no mass. There is tenderness (tender to palpation across lower abdomen, worse in the left lower quadrant). There is no rebound and no guarding.  Musculoskeletal: Normal range of motion. She exhibits no edema and no tenderness.  Lymphadenopathy:    She has no cervical adenopathy.  Neurological: She is alert and oriented to person, place, and time. She exhibits normal muscle tone. Coordination normal.  Skin: Skin is warm and dry. No rash noted. No erythema. No pallor.  Psychiatric: She has a normal mood and affect. Her behavior is normal. Judgment and thought content normal.    ED Course  Procedures (including critical care time) Labs Review Labs Reviewed  COMPREHENSIVE METABOLIC PANEL - Abnormal; Notable for the following:    Glucose, Bld  128 (*)    All other components within normal limits  CBC WITH DIFFERENTIAL - Abnormal; Notable for the following:    WBC 16.1 (*)    RDW 16.1 (*)    Neutrophils Relative % 88 (*)    Neutro Abs 14.2 (*)    Lymphocytes Relative 5 (*)    All other components within normal limits  LIPASE, BLOOD  URINALYSIS, ROUTINE W REFLEX MICROSCOPIC   Imaging Review No results found.  EKG Interpretation   None       MDM  No diagnosis found. 47 year old female with diffuse moderate to severe abdominal pain across lower abdomen with elevated white blood cell count.  Patient feeling better after pain medication.  Plan for CT abdomen pelvis for further workup.  Urine is still pending.  Care passed to Dr. Gwendolyn Grant awaiting a CT, and urine.    Olivia Mackie, MD 02/12/13 [phone]

## 2013-02-12 NOTE — H&P (Signed)
Chief Complaint: abdominal pain  HPI: Jill Olsen is a 47 year old female with a history of hyperthyroidism s/p radioactive iodine therapy, secondary hypothyroidism, depression, HLD and alcohol abuse who presented to [facility] today with abdominal pain.  Duration of symptoms is 12 hours.  Onset was sudden last night.  Location of pain was diffuse initially which localized overtime to the RLQ.  Time pattern is constant.  Rates her pain at 10/10 in severity.  Characterized as contraction like pain that is nagging and constant.  Modifying factors, none.  No aggravating or alleviating factors.  Associated with nausea, vomiting x2 episodes and sweats. She denies fever or chills. Coarse is improving.  She was given dilaudid in ED which helped alleviate her pain.   Previous surgeries include a tubal ligation.  She also underwent a colonoscopy and endoscopy in August of 2013 by Dr. Christella Hartigan which revealed polyps and mild gastritis.  She admits to drinking 12 beers per day.  This week she did not drink for 4 days straight.  She denies drug use or tobacco use.  Denies use of NSAIDs or anticoagulants.    Past Medical History  Diagnosis Date  . Depression   . Alcohol abuse   . Hyperlipidemia   . Hypothyroidism     Past Surgical History  Procedure Laterality Date  . Tubal ligation    . Ectopic pregnancy surgery      Family History  Problem Relation Age of Onset  . Colon polyps Father   . Heart disease Father   . Breast cancer Sister   . Crohn's disease Sister   . Ovarian cancer Maternal Aunt   . Thyroid disease Mother   . Heart disease Mother   . Depression Maternal Grandfather   . Glaucoma Maternal Grandfather    Social History:  reports that she has quit smoking. She has never used smokeless tobacco. She reports that she drinks about 7.2 ounces of alcohol per week. She reports that she does not use illicit drugs.  Allergies:  Allergies  Allergen Reactions  . Codeine Itching    Lips numb      (Not in a hospital admission)  Results for orders placed during the hospital encounter of 02/12/13 (from the past 48 hour(s))  LIPASE, BLOOD     Status: None   Collection Time    02/12/13  3:28 AM      Result Value Range   Lipase 36  11 - 59 U/L  COMPREHENSIVE METABOLIC PANEL     Status: Abnormal   Collection Time    02/12/13  3:28 AM      Result Value Range   Sodium 135  135 - 145 mEq/L   Potassium 4.1  3.5 - 5.1 mEq/L   Chloride 98  96 - 112 mEq/L   CO2 27  19 - 32 mEq/L   Glucose, Bld 128 (*) 70 - 99 mg/dL   BUN 11  6 - 23 mg/dL   Creatinine, Ser 1.61  0.50 - 1.10 mg/dL   Calcium 9.2  8.4 - 09.6 mg/dL   Total Protein 7.8  6.0 - 8.3 g/dL   Albumin 3.5  3.5 - 5.2 g/dL   AST 14  0 - 37 U/L   ALT 12  0 - 35 U/L   Alkaline Phosphatase 114  39 - 117 U/L   Total Bilirubin 0.4  0.3 - 1.2 mg/dL   GFR calc non Af Amer >90  >90 mL/min   GFR calc Af Amer >  90  >90 mL/min   Comment: (NOTE)     The eGFR has been calculated using the CKD EPI equation.     This calculation has not been validated in all clinical situations.     eGFR's persistently <90 mL/min signify possible Chronic Kidney     Disease.  CBC WITH DIFFERENTIAL     Status: Abnormal   Collection Time    02/12/13  3:28 AM      Result Value Range   WBC 16.1 (*) 4.0 - 10.5 K/uL   RBC 4.36  3.87 - 5.11 MIL/uL   Hemoglobin 13.0  12.0 - 15.0 g/dL   HCT 16.1  09.6 - 04.5 %   MCV 89.9  78.0 - 100.0 fL   MCH 29.8  26.0 - 34.0 pg   MCHC 33.2  30.0 - 36.0 g/dL   RDW 40.9 (*) 81.1 - 91.4 %   Platelets 275  150 - 400 K/uL   Neutrophils Relative % 88 (*) 43 - 77 %   Neutro Abs 14.2 (*) 1.7 - 7.7 K/uL   Lymphocytes Relative 5 (*) 12 - 46 %   Lymphs Abs 0.9  0.7 - 4.0 K/uL   Monocytes Relative 6  3 - 12 %   Monocytes Absolute 1.0  0.1 - 1.0 K/uL   Eosinophils Relative 0  0 - 5 %   Eosinophils Absolute 0.1  0.0 - 0.7 K/uL   Basophils Relative 0  0 - 1 %   Basophils Absolute 0.0  0.0 - 0.1 K/uL  URINALYSIS, ROUTINE W  REFLEX MICROSCOPIC     Status: Abnormal   Collection Time    02/12/13  6:59 AM      Result Value Range   Color, Urine YELLOW  YELLOW   APPearance CLEAR  CLEAR   Specific Gravity, Urine 1.009  1.005 - 1.030   pH 6.0  5.0 - 8.0   Glucose, UA NEGATIVE  NEGATIVE mg/dL   Hgb urine dipstick TRACE (*) NEGATIVE   Bilirubin Urine NEGATIVE  NEGATIVE   Ketones, ur NEGATIVE  NEGATIVE mg/dL   Protein, ur NEGATIVE  NEGATIVE mg/dL   Urobilinogen, UA 0.2  0.0 - 1.0 mg/dL   Nitrite NEGATIVE  NEGATIVE   Leukocytes, UA NEGATIVE  NEGATIVE  URINE MICROSCOPIC-ADD ON     Status: None   Collection Time    02/12/13  6:59 AM      Result Value Range   RBC / HPF 0-2  <3 RBC/hpf  POCT PREGNANCY, URINE     Status: None   Collection Time    02/12/13  7:37 AM      Result Value Range   Preg Test, Ur NEGATIVE  NEGATIVE   Comment:            THE SENSITIVITY OF THIS     METHODOLOGY IS >24 mIU/mL   Ct Abdomen Pelvis W Contrast  02/12/2013   CLINICAL DATA:  Generalized abdominal pain.  Nausea and vomiting.  EXAM: CT ABDOMEN AND PELVIS WITH CONTRAST  TECHNIQUE: Multidetector CT imaging of the abdomen and pelvis was performed using the standard protocol following bolus administration of intravenous contrast.  CONTRAST:  OMNIPAQUE IOHEXOL 300 MG/ML  SOLN  COMPARISON:  06/24/2010  FINDINGS: Tiny low-density lesion in the posterior right liver is too small to characterize but is probably a tiny hepatic cyst. The liver is otherwise normal in appearance. Spleen, stomach, duodenum, pancreas, gallbladder, and adrenal glands have normal imaging features. The kidneys are normal  bilaterally.  No abdominal aortic aneurysm. No free fluid or lymphadenopathy in the abdomen. No evidence for small bowel obstruction.  Imaging through the pelvis shows a dilated common ill-defined appendix with periappendiceal edema/ inflammation. (See image 60 of coronal series [id number]). No extraluminal gas or periappendiceal abscess.  No substantial  intraperitoneal free fluid in the pelvis. There is no pelvic sidewall lymphadenopathy. Bladder is unremarkable. Fibroid change noted in the fundus of the uterus there is no adnexal mass.  A few scattered diverticuli are seen in the left colon without diverticulitis. Terminal ileum is normal.  Bone windows reveal no worrisome lytic or sclerotic osseous lesions.  IMPRESSION: Dilated inflamed appendix with periappendiceal edema/inflammation. Imaging features consistent with acute appendicitis. No evidence for perforation or abscess at this time. I called these findings directly to Dr. Gwendolyn Grant in the emergency department at 0830 hr on 02/12/2013.   Electronically Signed   By: Kennith Center M.D.   On: 02/12/2013 08:31    Review of Systems  All other systems reviewed and are negative.    Blood pressure 147/87, pulse 88, temperature 98.6 F (37 C), temperature source Oral, resp. rate 20, weight 155 lb 14.4 oz (70.716 kg), last menstrual period 01/24/2013, SpO2 99.00%. Physical Exam  Constitutional: She is oriented to person, place, and time. She appears well-developed and well-nourished. No distress.  HENT:  Head: Normocephalic and atraumatic.  Mouth/Throat: No oropharyngeal exudate.  Neck: Normal range of motion. Neck supple.  Cardiovascular: Normal rate, regular rhythm, normal heart sounds and intact distal pulses.  Exam reveals no gallop and no friction rub.   No murmur heard. Respiratory: Effort normal and breath sounds normal. No respiratory distress. She has no wheezes. She has no rales. She exhibits no tenderness.  GI: Soft. Bowel sounds are normal. She exhibits no distension and no mass. There is tenderness. There is rebound. There is no guarding.  Musculoskeletal: She exhibits no edema and no tenderness.  Lymphadenopathy:    She has no cervical adenopathy.  Neurological: She is alert and oriented to person, place, and time.  Skin: Skin is warm and dry. No rash noted. She is not diaphoretic.  No erythema. No pallor.  Psychiatric: She has a normal mood and affect. Her behavior is normal. Judgment and thought content normal.     Assessment/Plan Acute appendicitis We will proceed with a laparoscopic appendectomy this afternoon.  We discussed in detail the risks and benefits of the surgery including but not limited to infection, bleeding, possible open, injury to bowel, perforation.  She verbalizes understanding and wishes to proceed.  We will obtain a consent.  Continue to keep NPO.  Start IVF, antibiotics and IV pain medication.  Alcohol abuse -start CIWA protocol as a precaution  Hypothyroidism -continue with home meds  Depression -continue with cymbalta  Willodean Leven ANP-BC 02/12/2013, 10:06 AM

## 2013-02-12 NOTE — Preoperative (Signed)
Beta Blockers   Reason not to administer Beta Blockers:Not Applicable 

## 2013-02-12 NOTE — Progress Notes (Signed)
Patient reports that she started itching post fentanyl administration it stopped, then she reports that she got dilaudid and she started itching again. Patient given benadryl in ED reports relief. Patient now reports she is itching again no hives noted. Notified Dr. Michelle Piper no further orders given. Added both medications to allergy list.

## 2013-02-12 NOTE — Transfer of Care (Signed)
Immediate Anesthesia Transfer of Care Note  Patient: Jill Olsen  Procedure(s) Performed: Procedure(s): APPENDECTOMY LAPAROSCOPIC (N/A)  Patient Location: PACU  Anesthesia Type:General  Level of Consciousness: awake, alert  and oriented  Airway & Oxygen Therapy: Patient Spontanous Breathing and Patient connected to nasal cannula oxygen  Post-op Assessment: Report given to PACU RN, Post -op Vital signs reviewed and stable and Patient moving all extremities X 4  Post vital signs: Reviewed and stable  Complications: No apparent anesthesia complications

## 2013-02-12 NOTE — ED Notes (Signed)
Pt with diffuse generalized abdominal pain.  Pt describes as being a circle around her entire abdomen.

## 2013-02-12 NOTE — ED Provider Notes (Signed)
0800 - Care from Dr. Norlene Campbell. 12F here with abdominal pain. I spoke with the Radiologist, patient has acute appendicitis. Surgery consulted.  1. Appendicitis      Dagmar Hait, MD 02/12/13 [phone]

## 2013-02-12 NOTE — H&P (Signed)
Scheduled for lap appy.  Does not look perforated on CT.  Preoperative antibiotics.  Marta Lamas. Gae Bon, MD, FACS [phone] [facility] Surgery

## 2013-02-12 NOTE — Op Note (Signed)
OPERATIVE REPORT  DATE OF OPERATION: 02/12/2013  PATIENT:  Jill Olsen  47 y.o. female  PRE-OPERATIVE DIAGNOSIS:  ACUTE APPENDICITIS  POST-OPERATIVE DIAGNOSIS:  ACUTE APPENDICITIS  PROCEDURE:  Procedure(s): APPENDECTOMY LAPAROSCOPIC  SURGEON:  Surgeon(s): Cherylynn Ridges, MD  ASSISTANT: Nonoe  ANESTHESIA:   general  EBL: <20 ml  BLOOD ADMINISTERED: none  DRAINS: none   SPECIMEN:  Source of Specimen:  Appendix  COUNTS CORRECT:  YES  PROCEDURE DETAILS: The patient was taken to the operating room and placed on the table in the supine position. After an adequate general endotracheal anesthetic was administered she was prepped and draped in the usual sterile manner exposing her entire abdomen.  Preoperative diagnoses was acute appendicitis. After proper time out was performed identifying the patient and the procedure to be performed, a supraumbilical midline incision was made using a #15 blade and taken down to the midline fascia. We incised the midline fascia using a 15 blade and grabbed the edges were Kocher clamps. We bluntly dissected down into the peritoneal cavity using a Kelly clamp. Once this was done a pursestring suture of 0 Vicryl was passed around the fascial opening. A Hassan cannula was passed into the peritoneal cavity. This was followed by right upper quadrant 5 mm cannula and left lower quadrant 12 mm millimeters cannula passed under direct vision. The patient was placed in Trendelenburg position with the left side tilted down.  The acutely inflamed appendix was not attached to any surrounding structures. We were able to dissect away from the base of the cecum using a dissector opening up a window between the mesoappendix and the base of the cecum. A blue stapler Endo GIA was passed across the base of the appendix where we transected the appendix from the cecum. We then used a white load 2.5 mm closure Endo GIA to come across the mesoappendix. There was excellent  hemostasis. Once the appendix was completely detached from this and other structures we retrieved it from the left lower quadrant site using an Endo Catch bag. Once this was done we irrigated with copious amounts saline up to about a liter.  The supraumbilical fascial site was closed using the pursestring which was in place. Each site at the skin level was injected with quarter percent Marcaine with epinephrine. The skin and left lower quadrant and the supraumbilical site was closed using running subcuticular stitch of 4-0 Monocryl. Dermabond Steri-Strips and Tegaderms use complete our dressings  It should be noted this time there was no evidence of perforation. All needle counts, sponge counts, and instrument counts were correct.  PATIENT DISPOSITION:  PACU - hemodynamically stable.   Cherylynn Ridges 11/24/20141:49 PM

## 2013-02-12 NOTE — Anesthesia Preprocedure Evaluation (Addendum)
Anesthesia Evaluation  Patient identified by MRN, date of birth, ID band Patient awake    Reviewed: Allergy & Precautions, H&P , NPO status , Patient's Chart, lab work & pertinent test results  Airway Mallampati: I TM Distance: >3 FB Neck ROM: Full    Dental  (+) Teeth Intact   Pulmonary former smoker,          Cardiovascular     Neuro/Psych Depression    GI/Hepatic   Endo/Other  Hypothyroidism   Renal/GU      Musculoskeletal   Abdominal   Peds  Hematology   Anesthesia Other Findings   Reproductive/Obstetrics                        Anesthesia Physical Anesthesia Plan  ASA: II and emergent  Anesthesia Plan: General   Post-op Pain Management:    Induction: Intravenous, Rapid sequence and Cricoid pressure planned  Airway Management Planned: Oral ETT  Additional Equipment:   Intra-op Plan:   Post-operative Plan: Extubation in OR  Informed Consent: I have reviewed the patients History and Physical, chart, labs and discussed the procedure including the risks, benefits and alternatives for the proposed anesthesia with the patient or authorized representative who has indicated his/her understanding and acceptance.   Dental advisory given  Plan Discussed with: CRNA  Anesthesia Plan Comments:        Anesthesia Quick Evaluation

## 2013-02-12 NOTE — Anesthesia Procedure Notes (Signed)
Procedure Name: Intubation Date/Time: 02/12/2013 1:00 PM Performed by: Sharlene Dory E Pre-anesthesia Checklist: Patient identified, Emergency Drugs available, Suction available, Patient being monitored and Timeout performed Patient Re-evaluated:Patient Re-evaluated prior to inductionOxygen Delivery Method: Circle system utilized Preoxygenation: Pre-oxygenation with 100% oxygen Intubation Type: IV induction and Rapid sequence Laryngoscope Size: Mac and 3 Grade View: Grade II Tube type: Oral Tube size: 7.0 mm Number of attempts: 1 Airway Equipment and Method: Stylet Placement Confirmation: ETT inserted through vocal cords under direct vision,  positive ETCO2 and breath sounds checked- equal and bilateral Secured at: 21 cm Tube secured with: Tape Dental Injury: Teeth and Oropharynx as per pre-operative assessment

## 2013-02-13 MED ORDER — HYDROCODONE-ACETAMINOPHEN 5-325 MG PO TABS: 1.0000 | ORAL_TABLET | ORAL | Status: DC | PRN

## 2013-02-13 NOTE — Discharge Summary (Signed)
Patient ID: CAELYN ROUTE MRN: [id number] DOB/AGE: 46-Nov-1967 46 y.o.  Admit date: 02/12/2013 Discharge date: 02/13/2013  Procedures: s/p lap appy  Consults: None  Reason for Admission: Jill Olsen is a 47 year old female with a history of hyperthyroidism s/p radioactive iodine therapy, secondary hypothyroidism, depression, HLD and alcohol abuse who presented to [facility] today with abdominal pain. Duration of symptoms is 12 hours. Onset was sudden last night. Location of pain was diffuse initially which localized overtime to the RLQ. Time pattern is constant. Rates her pain at 10/10 in severity. Characterized as contraction like pain that is nagging and constant. Modifying factors, none. No aggravating or alleviating factors. Associated with nausea, vomiting x2 episodes and sweats. She denies fever or chills. Coarse is improving. She was given dilaudid in ED which helped alleviate her pain. Previous surgeries include a tubal ligation. She also underwent a colonoscopy and endoscopy in August of 2013 by Dr. Christella Hartigan which revealed polyps and mild gastritis. She admits to drinking 12 beers per day. This week she did not drink for 4 days straight. She denies drug use or tobacco use. Denies use of NSAIDs or anticoagulants.   Admission Diagnoses:  1. Acute appendicitis  Hospital Course:  The patient was admitted and taken to the OR for an appendectomy.  She tolerated the procedure well.  The following day she was tolerating a regular diet and her pain was well controlled.  She was stable for dc home.  PE: Abd: soft, appropriately tender, incisions are c/d/i with some slight erythema around each incision, but minimal. +BS  Discharge Diagnoses:  1. Acute appendicitis, s/p lap appy  Discharge Medications:   Medication List         DULoxetine 60 MG capsule  Commonly known as:  CYMBALTA  Take 60 mg by mouth daily.     HYDROcodone-acetaminophen 5-325 MG per tablet  Commonly known as:  NORCO/VICODIN   Take 1-2 tablets by mouth every 4 (four) hours as needed for moderate pain.     ibuprofen 200 MG tablet  Commonly known as:  ADVIL,MOTRIN  Take 800 mg by mouth every 6 (six) hours as needed for moderate pain.     levothyroxine 175 MCG tablet  Commonly known as:  SYNTHROID, LEVOTHROID  Take 175 mcg by mouth daily.     rosuvastatin 20 MG tablet  Commonly known as:  CRESTOR  Take 20 mg by mouth daily.        Discharge Instructions:     Follow-up Information   Follow up with [facility] On 03/06/2013. (3:30pm, arrive at 3:00pm)    Contact information:   [address]   [place] [address] [phone]       Signed: Letha Cape 02/13/2013, 9:39 AM

## 2013-02-13 NOTE — Discharge Summary (Signed)
Redness along incision is not very concerning for infection or  Allergic reaction.  Will fowwo as outpatient.  Marta Lamas. Gae Bon, MD, FACS [phone] [facility] Surgery

## 2013-02-13 NOTE — Discharge Planning (Signed)
Patient discharged home in stable condition. Verbalizes understanding of all discharge instructions, including home medications and follow up appointments. 

## 2013-02-14 ENCOUNTER — Encounter (HOSPITAL_COMMUNITY): Payer: Self-pay | Admitting: General Surgery

## 2013-03-06 ENCOUNTER — Encounter (INDEPENDENT_AMBULATORY_CARE_PROVIDER_SITE_OTHER): Payer: 59

## 2013-05-05 ENCOUNTER — Emergency Department (HOSPITAL_COMMUNITY): Payer: 59

## 2013-05-05 ENCOUNTER — Emergency Department (HOSPITAL_COMMUNITY)
Admission: EM | Admit: 2013-05-05 | Discharge: 2013-05-06 | Disposition: A | Discharge status: Home / Self Care | Payer: 59 | Attending: Emergency Medicine | Admitting: Emergency Medicine

## 2013-05-05 ENCOUNTER — Encounter (HOSPITAL_COMMUNITY): Payer: Self-pay | Admitting: Emergency Medicine

## 2013-05-05 DIAGNOSIS — Z79899 Other long term (current) drug therapy: Secondary | ICD-10-CM | POA: Insufficient documentation

## 2013-05-05 DIAGNOSIS — Z3202 Encounter for pregnancy test, result negative: Secondary | ICD-10-CM | POA: Insufficient documentation

## 2013-05-05 DIAGNOSIS — F329 Major depressive disorder, single episode, unspecified: Secondary | ICD-10-CM | POA: Insufficient documentation

## 2013-05-05 DIAGNOSIS — R109 Unspecified abdominal pain: Secondary | ICD-10-CM

## 2013-05-05 DIAGNOSIS — N83209 Unspecified ovarian cyst, unspecified side: Secondary | ICD-10-CM | POA: Insufficient documentation

## 2013-05-05 DIAGNOSIS — E038 Other specified hypothyroidism: Secondary | ICD-10-CM | POA: Insufficient documentation

## 2013-05-05 DIAGNOSIS — R000 Tachycardia, unspecified: Secondary | ICD-10-CM | POA: Insufficient documentation

## 2013-05-05 DIAGNOSIS — F3289 Other specified depressive episodes: Secondary | ICD-10-CM | POA: Insufficient documentation

## 2013-05-05 DIAGNOSIS — Z87891 Personal history of nicotine dependence: Secondary | ICD-10-CM | POA: Insufficient documentation

## 2013-05-05 DIAGNOSIS — E785 Hyperlipidemia, unspecified: Secondary | ICD-10-CM | POA: Insufficient documentation

## 2013-05-05 DIAGNOSIS — Z9089 Acquired absence of other organs: Secondary | ICD-10-CM | POA: Insufficient documentation

## 2013-05-05 DIAGNOSIS — N83201 Unspecified ovarian cyst, right side: Secondary | ICD-10-CM

## 2013-05-05 LAB — WET PREP, GENITAL
Clue Cells Wet Prep HPF POC: NONE SEEN
TRICH WET PREP: NONE SEEN
YEAST WET PREP: NONE SEEN

## 2013-05-05 LAB — COMPREHENSIVE METABOLIC PANEL
ALT: 15 U/L (ref 0–35)
AST: 20 U/L (ref 0–37)
Albumin: 3.7 g/dL (ref 3.5–5.2)
Alkaline Phosphatase: 139 U/L — ABNORMAL HIGH (ref 39–117)
BUN: 8 mg/dL (ref 6–23)
CO2: 25 mEq/L (ref 19–32)
CREATININE: 0.55 mg/dL (ref 0.50–1.10)
Calcium: 8.9 mg/dL (ref 8.4–10.5)
Chloride: 97 mEq/L (ref 96–112)
GFR calc non Af Amer: >90 mL/min (ref >90)
Glucose, Bld: 89 mg/dL (ref 70–99)
Potassium: 3.9 mEq/L (ref 3.7–5.3)
Sodium: 137 mEq/L (ref 137–147)
TOTAL PROTEIN: 8.4 g/dL — AB (ref 6.0–8.3)
Total Bilirubin: 0.2 mg/dL — ABNORMAL LOW (ref 0.3–1.2)

## 2013-05-05 LAB — CBC WITH DIFFERENTIAL/PLATELET
BASOS PCT: 1 % (ref 0–1)
Basophils Absolute: 0.1 10*3/uL (ref 0.0–0.1)
EOS PCT: 1 % (ref 0–5)
Eosinophils Absolute: 0.1 10*3/uL (ref 0.0–0.7)
HEMATOCRIT: 38.7 % (ref 36.0–46.0)
Hemoglobin: 12.8 g/dL (ref 12.0–15.0)
Lymphocytes Relative: 20 % (ref 12–46)
Lymphs Abs: 1.7 10*3/uL (ref 0.7–4.0)
MCH: 29.7 pg (ref 26.0–34.0)
MCHC: 33.1 g/dL (ref 30.0–36.0)
MCV: 89.8 fL (ref 78.0–100.0)
MONOS PCT: 10 % (ref 3–12)
Monocytes Absolute: 0.9 10*3/uL (ref 0.1–1.0)
NEUTROS PCT: 68 % (ref 43–77)
Neutro Abs: 5.9 10*3/uL (ref 1.7–7.7)
Platelets: 377 10*3/uL (ref 150–400)
RBC: 4.31 MIL/uL (ref 3.87–5.11)
RDW: 16.9 % — ABNORMAL HIGH (ref 11.5–15.5)
WBC: 8.7 10*3/uL (ref 4.0–10.5)

## 2013-05-05 LAB — URINALYSIS, ROUTINE W REFLEX MICROSCOPIC
Bilirubin Urine: NEGATIVE
GLUCOSE, UA: NEGATIVE mg/dL
Hgb urine dipstick: NEGATIVE
KETONES UR: NEGATIVE mg/dL
LEUKOCYTES UA: NEGATIVE
Nitrite: NEGATIVE
PROTEIN: NEGATIVE mg/dL
Specific Gravity, Urine: 1.01 (ref 1.005–1.030)
Urobilinogen, UA: 0.2 mg/dL (ref 0.0–1.0)
pH: 5.5 (ref 5.0–8.0)

## 2013-05-05 LAB — PREGNANCY, URINE: Preg Test, Ur: NEGATIVE

## 2013-05-05 LAB — LIPASE, BLOOD: LIPASE: 41 U/L (ref 11–59)

## 2013-05-05 MED ORDER — KETOROLAC TROMETHAMINE 30 MG/ML IJ SOLN
30.0000 mg | Freq: Once | INTRAMUSCULAR | Status: AC
  Administered 2013-05-05: 30 mg via INTRAVENOUS
  Filled 2013-05-05: qty 1

## 2013-05-05 MED ORDER — DIPHENHYDRAMINE HCL 50 MG/ML IJ SOLN
25.0000 mg | Freq: Once | INTRAMUSCULAR | Status: AC
  Administered 2013-05-05: 12.5 mg via INTRAVENOUS
  Filled 2013-05-05: qty 1

## 2013-05-05 MED ORDER — IOHEXOL 300 MG/ML  SOLN
100.0000 mL | Freq: Once | INTRAMUSCULAR | Status: AC | PRN
  Administered 2013-05-05: 100 mL via INTRAVENOUS

## 2013-05-05 MED ORDER — IOHEXOL 300 MG/ML  SOLN
20.0000 mL | INTRAMUSCULAR | Status: DC
  Administered 2013-05-05: 25 mL via ORAL

## 2013-05-05 MED ORDER — OXYCODONE-ACETAMINOPHEN 5-325 MG PO TABS
2.0000 | ORAL_TABLET | Freq: Once | ORAL | Status: AC
  Administered 2013-05-05: 2 via ORAL
  Filled 2013-05-05: qty 2

## 2013-05-05 NOTE — ED Provider Notes (Signed)
Care assumed from Blanchie Dessert, MD  Jill Olsen presents with RLQ abd pain again this morning, worse with movement.  Pt s/p appendectomy in Nov 2014 with complete resolution.  This pain is completely different from her previous pain.  She is urinating and defecating without pain or abnormality.  No nausea or vomiting.  LMP 2 weeks ago.  Exam with no adnexal tenderness. Low concern for ovarian torsion.  Patient without pain improvement on Toradol and was giveb by mouth Percocet as she declined IV pain control due to itching.  Plan: CT abdomen pelvis pending.  If normal will d/c home with pain control.     Face to face Exam:   General: Awake, appears uncomfortable HEENT: Atraumatic  Cardiac: Tachycardic Resp: Normal effort  Abd: Nondistended, guarding of the right lower quadrant with significant pain  Neuro:No focal weakness  Lymph: No adenopathy   12:55 AM Pt with improved symptoms after percocet.  CT abd/pelvis with dominant right ovarian follicular cyst, but no other acute abnormality.  I personally reviewed the imaging tests through PACS system.  I reviewed available ER/hospitalization records through the EMR.  Discussed my recommendation of close OB/GYN follow-up and reasons the return to the ED including fever, worsening pain or intractable vomiting.    It has been determined that no acute conditions requiring further emergency intervention are present at this time. The patient/guardian have been advised of the diagnosis and plan. We have discussed signs and symptoms that warrant return to the ED, such as changes or worsening in symptoms.   Vital signs are stable at discharge.   BP 157/92  Pulse 100  Temp(Src) 98.7 F (37.1 C) (Oral)  Resp 27  SpO2 96%  LMP 04/21/2013  Patient/guardian has voiced understanding and agreed to follow-up with the PCP or specialist.      .    Abigail Butts, PA-C 05/06/13 [phone]

## 2013-05-05 NOTE — ED Notes (Signed)
Patient transported to CT 

## 2013-05-05 NOTE — ED Notes (Signed)
Assumed pt care.  No change in pain status.  Pain under control while sitting still but increases with movement.  Will be able to better assess after moving for CT scan.

## 2013-05-05 NOTE — ED Provider Notes (Signed)
CSN: [id number]     Arrival date & time 05/05/13  1942 History   First MD Initiated Contact with Patient 05/05/13 2052     Chief Complaint  Patient presents with  . Abdominal Pain     (Consider location/radiation/quality/duration/timing/severity/associated sxs/prior Treatment) Patient is a 48 y.o. female presenting with abdominal pain. The history is provided by the patient.  Abdominal Pain Pain location:  RLQ Pain quality: sharp and stabbing   Pain radiation: right buttocks. Pain severity:  Severe Onset quality:  Sudden Duration:  1 day Timing:  Constant Progression:  Unchanged Chronicity:  New Context: awakening from sleep   Relieved by:  Not moving Worsened by:  Position changes and movement Ineffective treatments:  None tried Associated symptoms: no anorexia, no chest pain, no chills, no constipation, no cough, no diarrhea, no dysuria, no fever, no hematuria, no nausea, no shortness of breath, no vaginal bleeding, no vaginal discharge and no vomiting   Risk factors comment:  Status post appendectomy 11/14   Past Medical History  Diagnosis Date  . Depression   . Alcohol abuse   . Hyperlipidemia   . Hyperthyroidism     s/p radioactive iodine therapy. resolved.  . Secondary hypothyroidism    Past Surgical History  Procedure Laterality Date  . Tubal ligation    . Ectopic pregnancy surgery    . Laparoscopic appendectomy N/A 02/12/2013    Procedure: APPENDECTOMY LAPAROSCOPIC;  Surgeon: Gwenyth Ober, MD;  Location: [facility] OR;  Service: General;  Laterality: N/A;   Family History  Problem Relation Age of Onset  . Colon polyps Father   . Heart disease Father   . Breast cancer Sister   . Crohn's disease Sister   . Ovarian cancer Maternal Aunt   . Thyroid disease Mother   . Heart disease Mother   . Depression Maternal Grandfather   . Glaucoma Maternal Grandfather    History  Substance Use Topics  . Smoking status: Former Research scientist (life sciences)  . Smokeless tobacco: Never Used  .  Alcohol Use: 7.2 oz/week    12 Cans of beer per week   OB History   Grav Para Term Preterm Abortions TAB SAB Ect Mult Living                 Review of Systems  Constitutional: Negative for fever and chills.  Respiratory: Negative for cough and shortness of breath.   Cardiovascular: Negative for chest pain.  Gastrointestinal: Positive for abdominal pain. Negative for nausea, vomiting, diarrhea, constipation and anorexia.  Genitourinary: Negative for dysuria, hematuria, vaginal bleeding and vaginal discharge.  All other systems reviewed and are negative.      Allergies  Codeine; Dilaudid; and Fentanyl  Home Medications   Current Outpatient Rx  Name  Route  Sig  Dispense  Refill  . DULoxetine (CYMBALTA) 60 MG capsule   Oral   Take 60 mg by mouth daily.         Marland Kitchen ibuprofen (ADVIL,MOTRIN) 200 MG tablet   Oral   Take 800 mg by mouth every 6 (six) hours as needed for moderate pain.         Marland Kitchen levothyroxine (SYNTHROID, LEVOTHROID) 175 MCG tablet   Oral   Take 175 mcg by mouth daily.         . rosuvastatin (CRESTOR) 20 MG tablet   Oral   Take 20 mg by mouth daily.          BP 178/108  Pulse 113  Temp(Src) 98.7 F (37.1  C) (Oral)  Resp 18  SpO2 98%  LMP 04/21/2013 Physical Exam  Nursing note and vitals reviewed. Constitutional: She is oriented to person, place, and time. She appears well-developed and well-nourished. She appears distressed.  HENT:  Head: Normocephalic and atraumatic.  Mouth/Throat: Oropharynx is clear and moist.  Eyes: Conjunctivae and EOM are normal. Pupils are equal, round, and reactive to light.  Neck: Normal range of motion. Neck supple.  Cardiovascular: Regular rhythm and intact distal pulses.  Tachycardia present.   No murmur heard. Pulmonary/Chest: Effort normal and breath sounds normal. No respiratory distress. She has no wheezes. She has no rales.  Abdominal: Soft. She exhibits no distension. There is tenderness in the right lower  quadrant. There is guarding. There is no rebound and no CVA tenderness.    Genitourinary: Vagina normal and uterus normal. There is no tenderness on the right labia. There is no tenderness on the left labia. Cervix exhibits no motion tenderness, no discharge and no friability. Right adnexum displays no mass, no tenderness and no fullness. Left adnexum displays no mass, no tenderness and no fullness. No vaginal discharge found.  Musculoskeletal: Normal range of motion. She exhibits no edema and no tenderness.  Neurological: She is alert and oriented to person, place, and time.  Skin: Skin is warm and dry. No rash noted. No erythema.  Psychiatric: She has a normal mood and affect. Her behavior is normal.    ED Course  Procedures (including critical care time) Labs Review Labs Reviewed  CBC WITH DIFFERENTIAL - Abnormal; Notable for the following:    RDW 16.9 (*)    All other components within normal limits  COMPREHENSIVE METABOLIC PANEL - Abnormal; Notable for the following:    Total Protein 8.4 (*)    Alkaline Phosphatase 139 (*)    Total Bilirubin 0.2 (*)    All other components within normal limits  GC/CHLAMYDIA PROBE AMP  WET PREP, GENITAL  URINALYSIS, ROUTINE W REFLEX MICROSCOPIC  PREGNANCY, URINE  LIPASE, BLOOD   Imaging Review No results found.  EKG Interpretation    Date/Time:  Saturday May 05 2013 21:17:35 EST Ventricular Rate:  98 PR Interval:  164 QRS Duration: 89 QT Interval:  356 QTC Calculation: 454 R Axis:   99 Text Interpretation:  Sinus rhythm Anteroseptal infarct, age indeterminate Minimal ST elevation, inferior leads No significant change since last tracing Confirmed by Anitra Lauth  MD, Annlouise Gerety ([id number]) on 05/05/2013 9:29:57 PM            MDM   Final diagnoses:  None    Patient with significant right lower quadrant and right pelvic pain that started this morning when she woke up and has worsened. Patient is status post appendectomy approximately  4 months ago. Today she denies any bowel or bladder issues and no nausea or vomiting. Her last menstrual cycle was 2 weeks ago. On exam she appears uncomfortable and is tachycardic. There significant hip pain and guarding in the right lower quadrant. She has no symptoms of bowel obstruction. Low suspicion for a lumbar cause. No signs of cellulitis or vascular compromise of the leg. Concern for possible ovarian torsion versus ruptured cyst. Pelvic exam pending.  UA within normal limits, UPT negative, CBC, CMP and lipase all within normal limits. EKG within normal limits. Patient given Toradol for pain as she has had severe reactions to narcotics with intense itching.  10:10 PM Pelvic with no significant adnexal tenderness.  Will get CT to further eval.  No improvement with toradol.  Will give oral percocet due to less itching and benadryl.  Pt checked out to [employer] [facility] at 2300.  Blanchie Dessert, MD 05/05/13 2326

## 2013-05-05 NOTE — ED Notes (Signed)
The pt is c/o rlq pain since this am approx 0500am.  No nv  Or diarrhea.  Appendectomy in nov 2014.  lmp 2 weeks ago

## 2013-05-05 NOTE — ED Notes (Signed)
Pt states she is experiencing right lower abdominal pain, noticed this morning. States she had a normal bowel movement today and has been able to pass gas without difficulty. States she had her appendix removed in November.

## 2013-05-05 NOTE — ED Notes (Signed)
Pt completed drinking po contrast.  CT notified.

## 2013-05-05 NOTE — ED Notes (Signed)
Assessment and acquity by chris Mayerly Kaman rn not emily rn

## 2013-05-06 MED ORDER — OXYCODONE-ACETAMINOPHEN 5-325 MG PO TABS: 1.0000 | ORAL_TABLET | ORAL | Status: DC | PRN

## 2013-05-06 NOTE — ED Provider Notes (Signed)
Medical screening examination/treatment/procedure(s) were performed by non-physician practitioner and as supervising physician I was immediately available for consultation/collaboration.  EKG Interpretation    Date/Time:  Saturday May 05 2013 21:17:35 EST Ventricular Rate:  98 PR Interval:  164 QRS Duration: 89 QT Interval:  356 QTC Calculation: 454 R Axis:   99 Text Interpretation:  Sinus rhythm Anteroseptal infarct, age indeterminate Minimal ST elevation, inferior leads No significant change since last tracing Confirmed by Maryan Rued  MD, Fortunato Nordin ([id number]) on 05/05/2013 9:29:57 PM              Blanchie Dessert, MD 05/06/13 1234

## 2013-05-06 NOTE — Discharge Instructions (Signed)
1. Medications: percocet for pain, usual home medications 2. Treatment: rest, drink plenty of fluids, take with benadryl as needed 3. Follow Up: Please followup with your OB/GYN for discussion of your diagnoses and further evaluation after today's visit;   Ovarian Cyst An ovarian cyst is a fluid-filled sac that forms on an ovary. The ovaries are small organs that produce eggs in women. Various types of cysts can form on the ovaries. Most are not cancerous. Many do not cause problems, and they often go away on their own. Some may cause symptoms and require treatment. Common types of ovarian cysts include:  Functional cysts These cysts may occur every month during the menstrual cycle. This is normal. The cysts usually go away with the next menstrual cycle if the woman does not get pregnant. Usually, there are no symptoms with a functional cyst.  Endometrioma cysts These cysts form from the tissue that lines the uterus. They are also called "chocolate cysts" because they become filled with blood that turns brown. This type of cyst can cause pain in the lower abdomen during intercourse and with your menstrual period.  Cystadenoma cysts This type develops from the cells on the outside of the ovary. These cysts can get very big and cause lower abdomen pain and pain with intercourse. This type of cyst can twist on itself, cut off its blood supply, and cause severe pain. It can also easily rupture and cause a lot of pain.  Dermoid cysts This type of cyst is sometimes found in both ovaries. These cysts may contain different kinds of body tissue, such as skin, teeth, hair, or cartilage. They usually do not cause symptoms unless they get very big.  Theca lutein cysts These cysts occur when too much of a certain hormone (human chorionic gonadotropin) is produced and overstimulates the ovaries to produce an egg. This is most common after procedures used to assist with the conception of a baby (in vitro  fertilization). CAUSES   Fertility drugs can cause a condition in which multiple large cysts are formed on the ovaries. This is called ovarian hyperstimulation syndrome.  A condition called polycystic ovary syndrome can cause hormonal imbalances that can lead to nonfunctional ovarian cysts. SIGNS AND SYMPTOMS  Many ovarian cysts do not cause symptoms. If symptoms are present, they may include:  Pelvic pain or pressure.  Pain in the lower abdomen.  Pain during sexual intercourse.  Increasing girth (swelling) of the abdomen.  Abnormal menstrual periods.  Increasing pain with menstrual periods.  Stopping having menstrual periods without being pregnant. DIAGNOSIS  These cysts are commonly found during a routine or annual pelvic exam. Tests may be ordered to find out more about the cyst. These tests may include:  Ultrasound.  X-ray of the pelvis.  CT scan.  MRI.  Blood tests. TREATMENT  Many ovarian cysts go away on their own without treatment. Your health care provider may want to check your cyst regularly for 2 3 months to see if it changes. For women in menopause, it is particularly important to monitor a cyst closely because of the higher rate of ovarian cancer in menopausal women. When treatment is needed, it may include any of the following:  A procedure to drain the cyst (aspiration). This may be done using a long needle and ultrasound. It can also be done through a laparoscopic procedure. This involves using a thin, lighted tube with a tiny camera on the end (laparoscope) inserted through a small incision.  Surgery to remove  the whole cyst. This may be done using laparoscopic surgery or an open surgery involving a larger incision in the lower abdomen.  Hormone treatment or birth control pills. These methods are sometimes used to help dissolve a cyst. HOME CARE INSTRUCTIONS   Only take over-the-counter or prescription medicines as directed by your health care  provider.  Follow up with your health care provider as directed.  Get regular pelvic exams and Pap tests. SEEK MEDICAL CARE IF:   Your periods are late, irregular, or painful, or they stop.  Your pelvic pain or abdominal pain does not go away.  Your abdomen becomes larger or swollen.  You have pressure on your bladder or trouble emptying your bladder completely.  You have pain during sexual intercourse.  You have feelings of fullness, pressure, or discomfort in your stomach.  You lose weight for no apparent reason.  You feel generally ill.  You become constipated.  You lose your appetite.  You develop acne.  You have an increase in body and facial hair.  You are gaining weight, without changing your exercise and eating habits.  You think you are pregnant. SEEK IMMEDIATE MEDICAL CARE IF:   You have increasing abdominal pain.  You feel sick to your stomach (nauseous), and you throw up (vomit).  You develop a fever that comes on suddenly.  You have abdominal pain during a bowel movement.  Your menstrual periods become heavier than usual. Document Released: 03/08/2005 Document Revised: 12/27/2012 Document Reviewed: 11/13/2012 [facility] Patient Information 2014 [place].

## 2013-05-06 NOTE — ED Notes (Signed)
Returned from [place].  Pain much improved, 3/10.

## 2013-05-09 LAB — GC/CHLAMYDIA PROBE AMP
CT PROBE, AMP APTIMA: NEGATIVE
GC Probe RNA: NEGATIVE

## 2014-01-20 HISTORY — PX: APPENDECTOMY: SHX54

## 2015-09-20 IMAGING — CT CT ABD-PELV W/ CM
2 of 5 series · 17 of 46 positions shown, 19 images · IV contrast (CONTRAST)
Comparison: Prior CT abdomen/pelvis 02/12/2013

CLINICAL DATA: Right lower quadrant pain

EXAM:
CT ABDOMEN AND PELVIS WITH CONTRAST
TECHNIQUE: Multidetector CT imaging of the abdomen and pelvis was performed
using the standard protocol following bolus administration of
intravenous contrast.
CONTRAST:  100mL OMNIPAQUE IOHEXOL 300 MG/ML  SOLN

[Series 2: routine · axial · 0.71mm/px · z∈[-816,-451]mm · 14 of 83 slices shown, 16 images]
[im 5/83  soft-tissue]
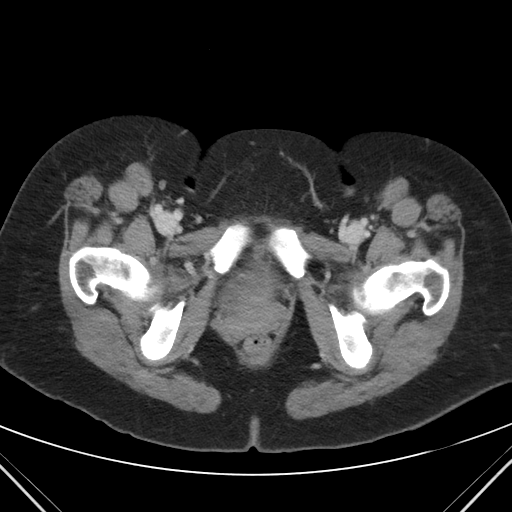
[im 5/83  bone]
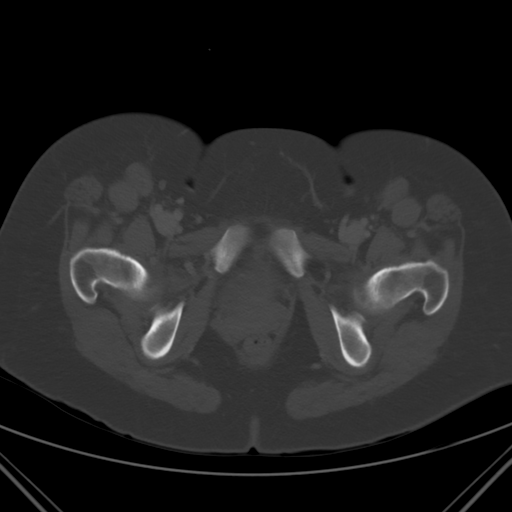
[im 10/83  soft-tissue]
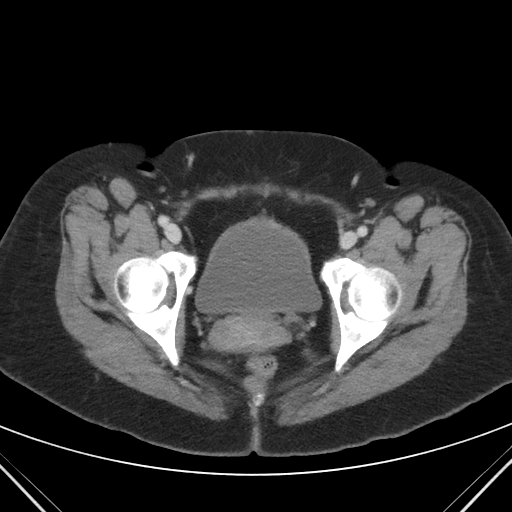
[im 19/83  soft-tissue]
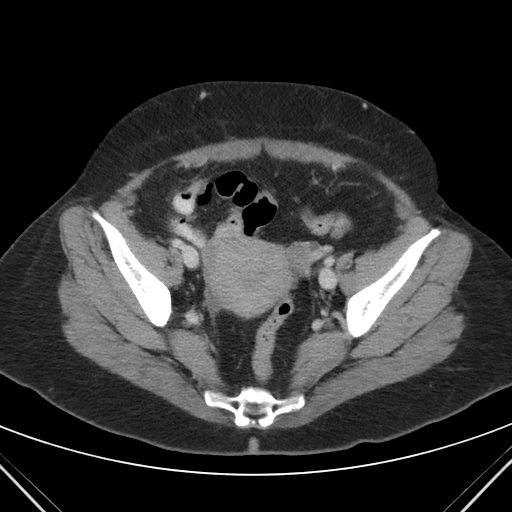
[im 23/83  soft-tissue]
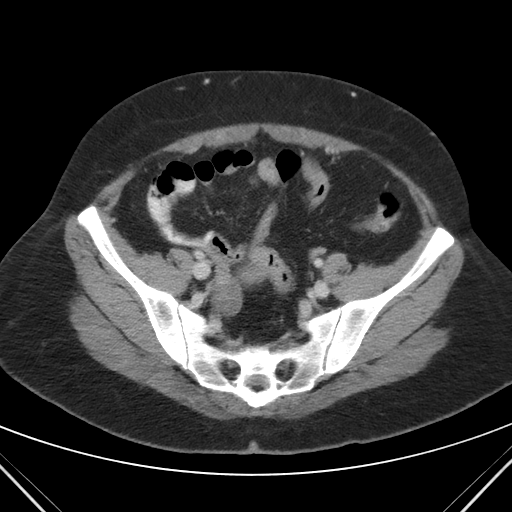
[im 28/83  soft-tissue]
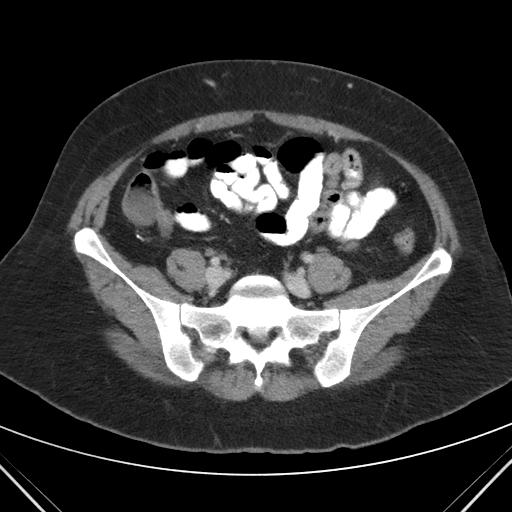
[im 32/83  soft-tissue]
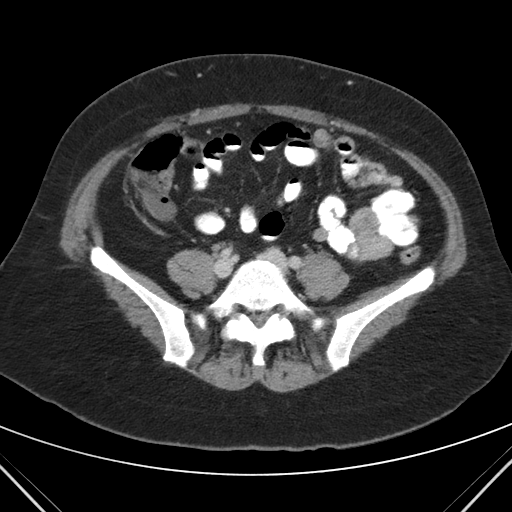
[im 37/83  soft-tissue]
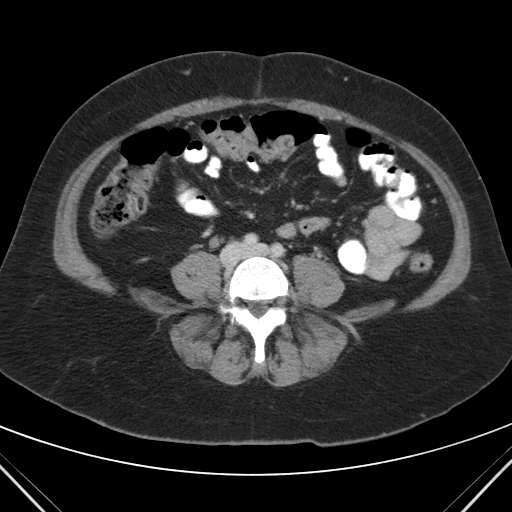
[im 46/83  soft-tissue]
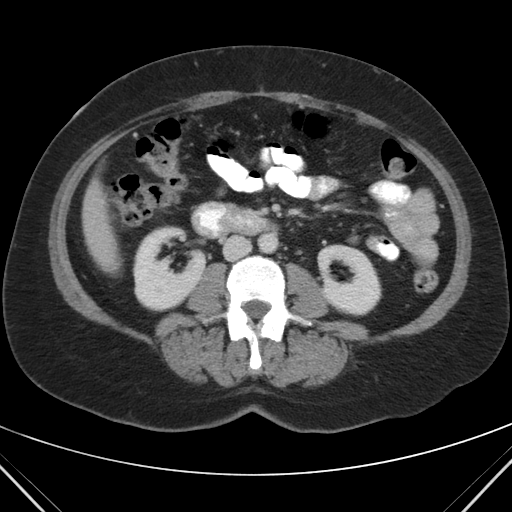
[im 51/83  soft-tissue]
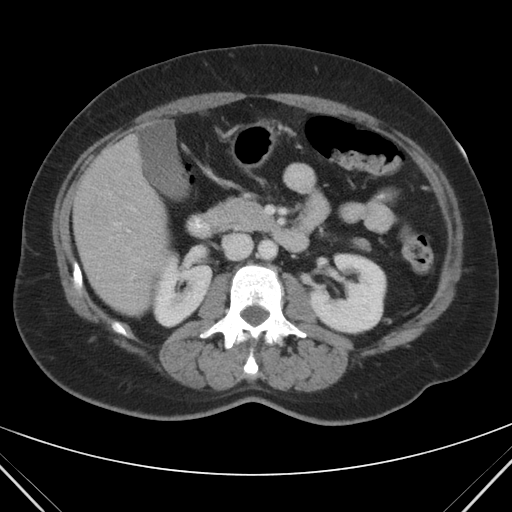
[im 51/83  bone]
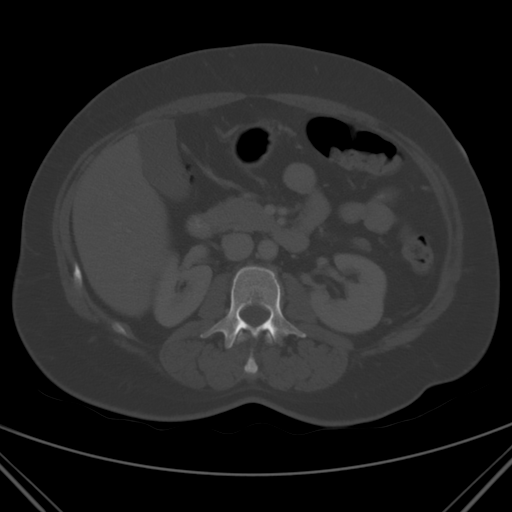
[im 55/83  soft-tissue]
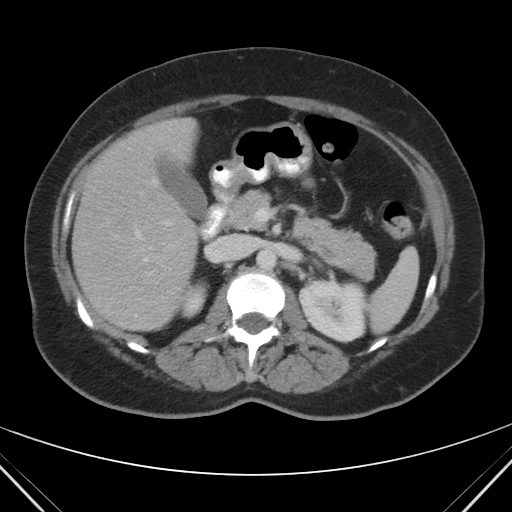
[im 60/83  soft-tissue]
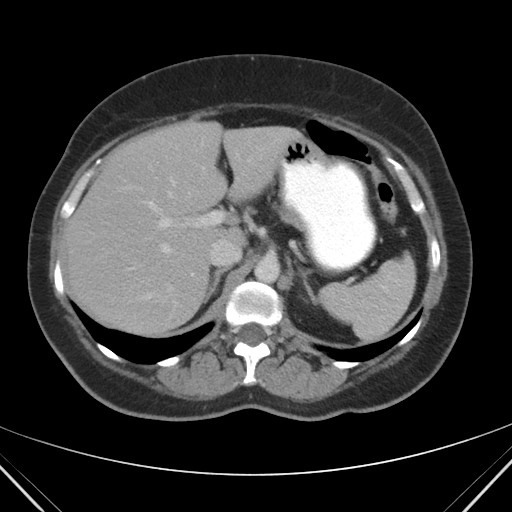
[im 64/83  soft-tissue]
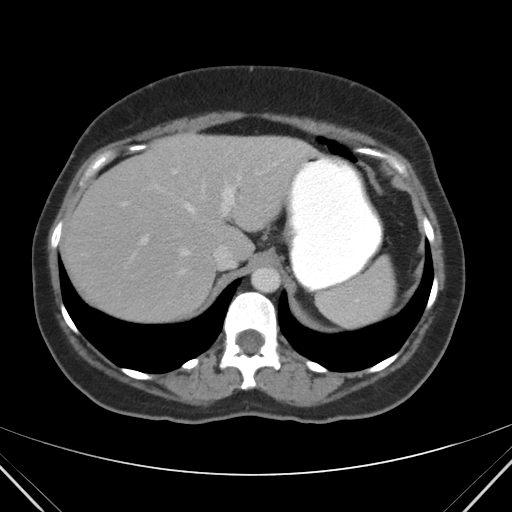
[im 73/83  soft-tissue]
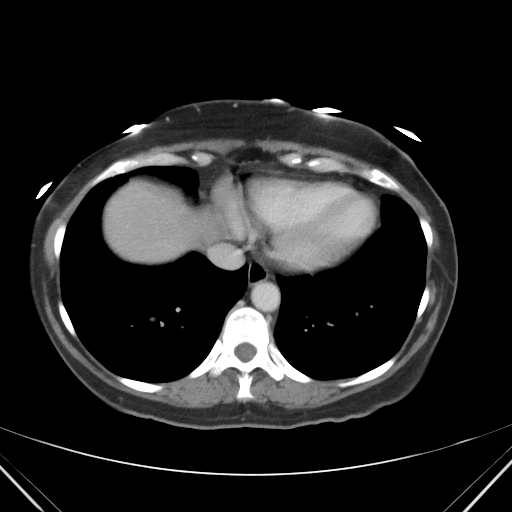
[im 78/83  soft-tissue]
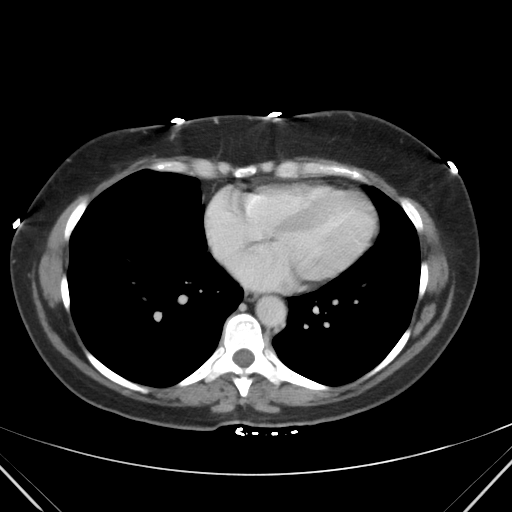

[coronals · coronal · 0.80mm/px · 3 of 92 slices shown]
[im 31/92  soft-tissue]
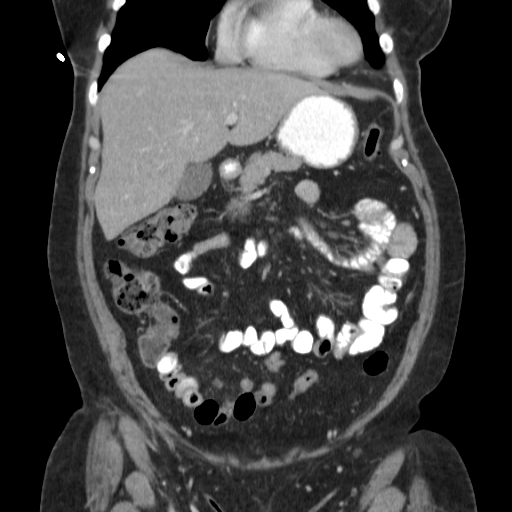
[im 41/92  soft-tissue]
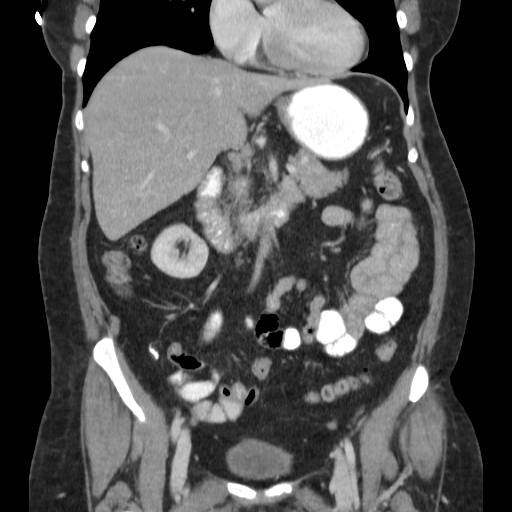
[im 51/92  soft-tissue]
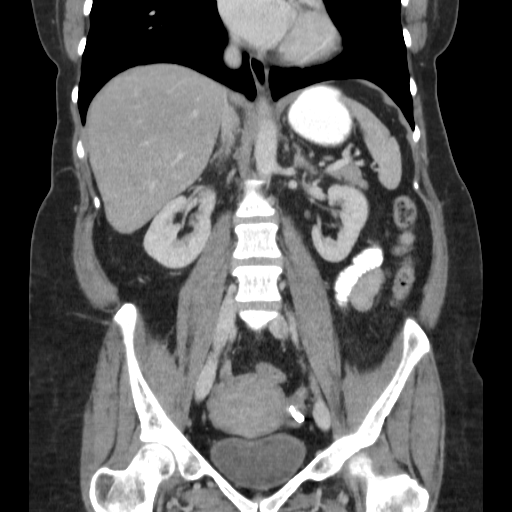

[17 of 46 positions shown; findings below may reference images not displayed]

FINDINGS: Lower Chest: The lung bases are clear. Visualized cardiac structures
are within normal limits for size. No pericardial effusion.
Unremarkable visualized distal thoracic esophagus.

Abdomen: Unremarkable CT appearance of the stomach, duodenum,
spleen, adrenal glands, pancreas and liver. Stable tiny
hypoattenuating lesion in hepatic segment 6 which remains too small
to characterize. Gallbladder is unremarkable. No intra or
extrahepatic biliary ductal dilatation.

Unremarkable appearance of the bilateral kidneys. No focal solid
lesion, hydronephrosis or nephrolithiasis.

Surgical changes of prior appendectomy. No evidence of a bowel
obstruction or focal bowel wall thickening. Terminal ileum is
unremarkable. No free fluid or suspicious adenopathy.

Pelvis: Unremarkable bladder, uterus and adnexa. Trace free fluid is
likely physiologic. No suspicious adenopathy. Dominant right ovarian
follicular cyst. Dystrophic calcifications affiliated with the left
ovary are similar compared to prior.

Bones/Soft Tissues: No acute fracture or aggressive appearing lytic
or blastic osseous lesion.

Vascular: No significant atherosclerotic vascular disease,
aneurysmal dilatation or acute abnormality.
IMPRESSION: 1. No acute abnormality in the abdomen or pelvis to explain the
patient's clinical symptoms.
2. Dominant right ovarian follicular cyst. This could possibly
represent a source for the patient's right lower quadrant pain.
3. Surgical changes of prior appendectomy.

## 2016-04-27 DIAGNOSIS — Z1389 Encounter for screening for other disorder: Secondary | ICD-10-CM | POA: Diagnosis not present

## 2016-07-29 DIAGNOSIS — H1013 Acute atopic conjunctivitis, bilateral: Secondary | ICD-10-CM | POA: Diagnosis not present

## 2016-08-03 DIAGNOSIS — E039 Hypothyroidism, unspecified: Secondary | ICD-10-CM | POA: Diagnosis not present

## 2016-11-08 DIAGNOSIS — E038 Other specified hypothyroidism: Secondary | ICD-10-CM | POA: Diagnosis not present

## 2017-01-04 DIAGNOSIS — R5381 Other malaise: Secondary | ICD-10-CM | POA: Diagnosis not present

## 2017-01-04 DIAGNOSIS — Z1389 Encounter for screening for other disorder: Secondary | ICD-10-CM | POA: Diagnosis not present

## 2017-01-04 DIAGNOSIS — E785 Hyperlipidemia, unspecified: Secondary | ICD-10-CM | POA: Diagnosis not present

## 2017-01-05 DIAGNOSIS — E039 Hypothyroidism, unspecified: Secondary | ICD-10-CM | POA: Diagnosis not present

## 2017-07-19 DIAGNOSIS — Z1389 Encounter for screening for other disorder: Secondary | ICD-10-CM | POA: Diagnosis not present

## 2017-07-19 DIAGNOSIS — E7849 Other hyperlipidemia: Secondary | ICD-10-CM | POA: Diagnosis not present

## 2017-07-19 DIAGNOSIS — E038 Other specified hypothyroidism: Secondary | ICD-10-CM | POA: Diagnosis not present

## 2018-01-26 DIAGNOSIS — K635 Polyp of colon: Secondary | ICD-10-CM | POA: Diagnosis not present

## 2018-01-26 DIAGNOSIS — E7849 Other hyperlipidemia: Secondary | ICD-10-CM | POA: Diagnosis not present

## 2018-01-26 DIAGNOSIS — E038 Other specified hypothyroidism: Secondary | ICD-10-CM | POA: Diagnosis not present

## 2018-01-26 DIAGNOSIS — R202 Paresthesia of skin: Secondary | ICD-10-CM | POA: Diagnosis not present

## 2018-01-26 DIAGNOSIS — R5381 Other malaise: Secondary | ICD-10-CM | POA: Diagnosis not present

## 2018-03-02 DIAGNOSIS — E7849 Other hyperlipidemia: Secondary | ICD-10-CM | POA: Diagnosis not present

## 2018-07-24 DIAGNOSIS — K635 Polyp of colon: Secondary | ICD-10-CM | POA: Diagnosis not present

## 2018-07-24 DIAGNOSIS — E890 Postprocedural hypothyroidism: Secondary | ICD-10-CM | POA: Diagnosis not present

## 2018-07-24 DIAGNOSIS — E785 Hyperlipidemia, unspecified: Secondary | ICD-10-CM | POA: Diagnosis not present

## 2020-08-06 ENCOUNTER — Other Ambulatory Visit: Payer: Self-pay

## 2020-08-06 ENCOUNTER — Inpatient Hospital Stay (HOSPITAL_COMMUNITY)
Admission: EM | Admit: 2020-08-06 | Discharge: 2020-08-08 | DRG: 394 | Disposition: A | Discharge status: Home / Self Care | Payer: 59 | Attending: Internal Medicine | Admitting: Internal Medicine

## 2020-08-06 ENCOUNTER — Encounter (HOSPITAL_COMMUNITY): Payer: Self-pay

## 2020-08-06 DIAGNOSIS — K7460 Unspecified cirrhosis of liver: Secondary | ICD-10-CM

## 2020-08-06 DIAGNOSIS — K644 Residual hemorrhoidal skin tags: Secondary | ICD-10-CM | POA: Diagnosis not present

## 2020-08-06 DIAGNOSIS — D509 Iron deficiency anemia, unspecified: Secondary | ICD-10-CM | POA: Diagnosis not present

## 2020-08-06 DIAGNOSIS — E890 Postprocedural hypothyroidism: Secondary | ICD-10-CM | POA: Diagnosis present

## 2020-08-06 DIAGNOSIS — K648 Other hemorrhoids: Secondary | ICD-10-CM | POA: Diagnosis present

## 2020-08-06 DIAGNOSIS — D62 Acute posthemorrhagic anemia: Secondary | ICD-10-CM | POA: Diagnosis present

## 2020-08-06 DIAGNOSIS — E038 Other specified hypothyroidism: Secondary | ICD-10-CM

## 2020-08-06 DIAGNOSIS — K257 Chronic gastric ulcer without hemorrhage or perforation: Secondary | ICD-10-CM | POA: Diagnosis present

## 2020-08-06 DIAGNOSIS — D649 Anemia, unspecified: Secondary | ICD-10-CM | POA: Diagnosis not present

## 2020-08-06 DIAGNOSIS — K5730 Diverticulosis of large intestine without perforation or abscess without bleeding: Secondary | ICD-10-CM | POA: Diagnosis present

## 2020-08-06 DIAGNOSIS — Z20822 Contact with and (suspected) exposure to covid-19: Secondary | ICD-10-CM | POA: Diagnosis present

## 2020-08-06 DIAGNOSIS — Z79899 Other long term (current) drug therapy: Secondary | ICD-10-CM

## 2020-08-06 DIAGNOSIS — E876 Hypokalemia: Secondary | ICD-10-CM | POA: Diagnosis present

## 2020-08-06 DIAGNOSIS — F32A Depression, unspecified: Secondary | ICD-10-CM | POA: Diagnosis present

## 2020-08-06 DIAGNOSIS — E785 Hyperlipidemia, unspecified: Secondary | ICD-10-CM | POA: Diagnosis present

## 2020-08-06 DIAGNOSIS — K319 Disease of stomach and duodenum, unspecified: Secondary | ICD-10-CM | POA: Diagnosis present

## 2020-08-06 DIAGNOSIS — Z87891 Personal history of nicotine dependence: Secondary | ICD-10-CM

## 2020-08-06 DIAGNOSIS — Z79890 Hormone replacement therapy (postmenopausal): Secondary | ICD-10-CM

## 2020-08-06 DIAGNOSIS — F1010 Alcohol abuse, uncomplicated: Secondary | ICD-10-CM | POA: Diagnosis present

## 2020-08-06 DIAGNOSIS — K625 Hemorrhage of anus and rectum: Secondary | ICD-10-CM

## 2020-08-06 LAB — CBC WITH DIFFERENTIAL/PLATELET
Abs Immature Granulocytes: 0.02 10*3/uL (ref 0.00–0.07)
Basophils Absolute: 0.1 10*3/uL (ref 0.0–0.1)
Basophils Relative: 1 %
Eosinophils Absolute: 0.1 10*3/uL (ref 0.0–0.5)
Eosinophils Relative: 2 %
HCT: 22.7 % — ABNORMAL LOW (ref 36.0–46.0)
Hemoglobin: 6.2 g/dL — CL (ref 12.0–15.0)
Immature Granulocytes: 0 %
Lymphocytes Relative: 22 %
Lymphs Abs: 1.2 10*3/uL (ref 0.7–4.0)
MCH: 22.1 pg — ABNORMAL LOW (ref 26.0–34.0)
MCHC: 27.3 g/dL — ABNORMAL LOW (ref 30.0–36.0)
MCV: 81.1 fL (ref 80.0–100.0)
Monocytes Absolute: 0.9 10*3/uL (ref 0.1–1.0)
Monocytes Relative: 15 %
Neutro Abs: 3.4 10*3/uL (ref 1.7–7.7)
Neutrophils Relative %: 60 %
Platelets: 326 10*3/uL (ref 150–400)
RBC: 2.8 MIL/uL — ABNORMAL LOW (ref 3.87–5.11)
RDW: 17.5 % — ABNORMAL HIGH (ref 11.5–15.5)
WBC: 5.7 10*3/uL (ref 4.0–10.5)
nRBC: 0 % (ref 0.0–0.2)

## 2020-08-06 LAB — POC OCCULT BLOOD, ED: Fecal Occult Bld: NEGATIVE

## 2020-08-06 LAB — COMPREHENSIVE METABOLIC PANEL
ALT: 31 U/L (ref 0–44)
AST: 50 U/L — ABNORMAL HIGH (ref 15–41)
Albumin: 3.7 g/dL (ref 3.5–5.0)
Alkaline Phosphatase: 73 U/L (ref 38–126)
Anion gap: 11 (ref 5–15)
BUN: 8 mg/dL (ref 6–20)
CO2: 24 mmol/L (ref 22–32)
Calcium: 8.9 mg/dL (ref 8.9–10.3)
Chloride: 104 mmol/L (ref 98–111)
Creatinine, Ser: 0.56 mg/dL (ref 0.44–1.00)
GFR, Estimated: >60 mL/min (ref >60)
Glucose, Bld: 83 mg/dL (ref 70–99)
Potassium: 4 mmol/L (ref 3.5–5.1)
Sodium: 139 mmol/L (ref 135–145)
Total Bilirubin: 0.5 mg/dL (ref 0.3–1.2)
Total Protein: 7.5 g/dL (ref 6.5–8.1)

## 2020-08-06 LAB — PROTIME-INR
INR: 1 (ref 0.8–1.2)
Prothrombin Time: 13.4 seconds (ref 11.4–15.2)

## 2020-08-06 LAB — PREPARE RBC (CROSSMATCH)

## 2020-08-06 LAB — ABO/RH: ABO/RH(D): A POS

## 2020-08-06 LAB — I-STAT BETA HCG BLOOD, ED (MC, WL, AP ONLY): I-stat hCG, quantitative: <5 m[IU]/mL (ref <5)

## 2020-08-06 MED ORDER — ONDANSETRON HCL 4 MG PO TABS: 4.0000 mg | ORAL_TABLET | Freq: Four times a day (QID) | ORAL | Status: DC | PRN

## 2020-08-06 MED ORDER — ACETAMINOPHEN 325 MG PO TABS
650.0000 mg | ORAL_TABLET | Freq: Four times a day (QID) | ORAL | Status: DC | PRN
  Administered 2020-08-07 (×2): 650 mg via ORAL
  Filled 2020-08-06 (×2): qty 2

## 2020-08-06 MED ORDER — ROSUVASTATIN CALCIUM 20 MG PO TABS
20.0000 mg | ORAL_TABLET | Freq: Every day | ORAL | Status: DC
  Administered 2020-08-07 – 2020-08-08 (×2): 20 mg via ORAL
  Filled 2020-08-06 (×2): qty 1

## 2020-08-06 MED ORDER — SODIUM CHLORIDE 0.9% IV SOLUTION: Freq: Once | INTRAVENOUS | Status: AC

## 2020-08-06 MED ORDER — SODIUM CHLORIDE 0.9 % IV SOLN
10.0000 mL/h | Freq: Once | INTRAVENOUS | Status: AC
  Administered 2020-08-06: 10 mL/h via INTRAVENOUS

## 2020-08-06 MED ORDER — ACETAMINOPHEN 650 MG RE SUPP: 650.0000 mg | Freq: Four times a day (QID) | RECTAL | Status: DC | PRN

## 2020-08-06 MED ORDER — ONDANSETRON HCL 4 MG/2ML IJ SOLN: 4.0000 mg | Freq: Four times a day (QID) | INTRAMUSCULAR | Status: DC | PRN

## 2020-08-06 MED ORDER — LEVOTHYROXINE SODIUM 125 MCG PO TABS
125.0000 ug | ORAL_TABLET | Freq: Every day | ORAL | Status: DC
  Filled 2020-08-06: qty 1

## 2020-08-06 MED ORDER — DULOXETINE HCL 60 MG PO CPEP
60.0000 mg | ORAL_CAPSULE | Freq: Every day | ORAL | Status: DC
  Administered 2020-08-08: 60 mg via ORAL
  Filled 2020-08-06 (×2): qty 1

## 2020-08-06 MED ORDER — ESCITALOPRAM OXALATE 10 MG PO TABS
10.0000 mg | ORAL_TABLET | Freq: Every day | ORAL | Status: DC
  Administered 2020-08-07 – 2020-08-08 (×2): 10 mg via ORAL
  Filled 2020-08-06 (×2): qty 1

## 2020-08-06 NOTE — ED Triage Notes (Signed)
Pt states she seen her PCP today. Pt c/o bright red rectal bleeding with clots for past few months sporadically.  PCP office reports her Hgb 7.1. Pt c/o fatigue, dizziness.

## 2020-08-06 NOTE — H&P (Signed)
History and Physical    Jill Olsen:[id number] DOB: 1965-04-23 DOA: 08/06/2020  PCP: Reynold Bowen, MD   Patient coming from: Home   Chief Complaint: Fatigue, lightheadedness, rectal bleeding   HPI: Jill Olsen is a 55 y.o. female with medical history significant for depression, hyperlipidemia, and hypothyroidism, now presenting to emergency department with fatigue tenderness patient reports that she has been passing red blood and clots per rectum adequately.  More consistently in the past 2 months.  She denies any abdominal pain, nausea, or vomiting.  She was evaluated by her PCP for this, found to have hemoglobin of 7.1, and directed to the emergency department for further evaluation.  She had a colonoscopy in 2013 with external hemorrhoids and a single polyp without evidence for malignancy.  She also had upper endoscopy in 2013 with moderate gastritis negative for dysplasia, malignancy, or H. pylori.  ED Course: Upon arrival to the ED, patient is found to be afebrile, saturating well on room air, and with stable blood pressure.  Chemistry panel was unremarkable and CBC notable for hemoglobin of 6.2.  Fecal occult blood testing was negative.  Gastroenterology was consulted by the ED physician and 2 units RBC were ordered for transfusion.   Review of Systems:  All other systems reviewed and apart from HPI, are negative.  Past Medical History:  Diagnosis Date  . Alcohol abuse   . Depression   . Hyperlipidemia   . Hyperthyroidism    s/p radioactive iodine therapy. resolved.  . Secondary hypothyroidism     Past Surgical History:  Procedure Laterality Date  . ECTOPIC PREGNANCY SURGERY    . LAPAROSCOPIC APPENDECTOMY N/A 02/12/2013   Procedure: APPENDECTOMY LAPAROSCOPIC;  Surgeon: Gwenyth Ober, MD;  Location: [facility];  Service: General;  Laterality: N/A;  . TUBAL LIGATION      Social History:   reports that she has quit smoking. She has never used smokeless tobacco. She  reports current alcohol use of about 12.0 standard drinks of alcohol per week. She reports that she does not use drugs.  Allergies  Allergen Reactions  . Codeine Itching    Lips numb  . Dilaudid [Hydromorphone Hcl] Itching  . Fentanyl Itching    Family History  Problem Relation Age of Onset  . Colon polyps Father   . Heart disease Father   . Breast cancer Sister   . Crohn's disease Sister   . Ovarian cancer Maternal Aunt   . Thyroid disease Mother   . Heart disease Mother   . Depression Maternal Grandfather   . Glaucoma Maternal Grandfather      Prior to Admission medications   Medication Sig Start Date End Date Taking? Authorizing Provider  acetaminophen (TYLENOL) 500 MG tablet Take 500 mg by mouth every 6 (six) hours as needed for moderate pain.   Yes [provider]  DULoxetine (CYMBALTA) 60 MG capsule Take 60 mg by mouth daily.   Yes [provider]  escitalopram (LEXAPRO) 10 MG tablet Take 1 tablet by mouth daily. 07/25/20  Yes [provider]  levothyroxine (UNITHROID) 125 MCG tablet Take 125 mcg by mouth daily before breakfast.   Yes [provider]  REPATHA SURECLICK 585 MG/ML SOAJ Inject 140 mg into the skin every 14 (fourteen) days. 07/25/20  Yes [provider]  rosuvastatin (CRESTOR) 20 MG tablet Take 20 mg by mouth daily.   Yes [provider]  levothyroxine (SYNTHROID, LEVOTHROID) 175 MCG tablet Take 175 mcg by mouth daily. Patient  not taking: Reported on 08/06/2020    [provider]  oxyCODONE-acetaminophen (PERCOCET/ROXICET) 5-325 MG per tablet Take 1-2 tablets by mouth every 4 (four) hours as needed for severe pain. Patient not taking: Reported on 08/06/2020 05/06/13   Muthersbaugh, Jarrett Soho, PA-C    Physical Exam: Vitals:   08/06/20 1930 08/06/20 2000 08/06/20 2030 08/06/20 2219  BP: (!) 154/76 (!) 163/68 (!) 158/80 (!) 161/70  Pulse: 80 88 85 96  Resp: 15 17 17 15   Temp:    99 F (37.2 C)  TempSrc:     Oral  SpO2: 100% 100% 100% 100%    Constitutional: NAD, calm  Eyes: PERTLA, lids and conjunctivae normal ENMT: Mucous membranes are moist. Posterior pharynx clear of any exudate or lesions.   Neck:  supple, no masses  Respiratory: no wheezing, no crackles. No accessory muscle use.  Cardiovascular: S1 & S2 heard, regular rate and rhythm. No extremity edema.  Abdomen: No distension, no tenderness, soft. Bowel sounds active.  Musculoskeletal: no clubbing / cyanosis. No joint deformity upper and lower extremities.   Skin: no significant rashes, lesions, ulcers. Warm, dry, well-perfused. Neurologic: CN 2-12 grossly intact. Sensation intact. Moving all extremities.  Psychiatric: Alert and oriented to person, place, and situation. Pleasant and cooperative.    Labs and Imaging on Admission: I have personally reviewed following labs and imaging studies  CBC: Recent Labs  Lab 08/06/20 1844  WBC 5.7  NEUTROABS 3.4  HGB 6.2*  HCT 22.7*  MCV 81.1  PLT 355   Basic Metabolic Panel: Recent Labs  Lab 08/06/20 1844  NA 139  K 4.0  CL 104  CO2 24  GLUCOSE 83  BUN 8  CREATININE 0.56  CALCIUM 8.9   GFR: CrCl cannot be calculated (Unknown ideal weight.). Liver Function Tests: Recent Labs  Lab 08/06/20 1844  AST 50*  ALT 31  ALKPHOS 73  BILITOT 0.5  PROT 7.5  ALBUMIN 3.7   No results for input(s): LIPASE, AMYLASE in the last 168 hours. No results for input(s): AMMONIA in the last 168 hours. Coagulation Profile: Recent Labs  Lab 08/06/20 1844  INR 1.0   Cardiac Enzymes: No results for input(s): CKTOTAL, CKMB, CKMBINDEX, TROPONINI in the last 168 hours. BNP (last 3 results) No results for input(s): PROBNP in the last 8760 hours. HbA1C: No results for input(s): HGBA1C in the last 72 hours. CBG: No results for input(s): GLUCAP in the last 168 hours. Lipid Profile: No results for input(s): CHOL, HDL, LDLCALC, TRIG, CHOLHDL, LDLDIRECT in the last 72 hours. Thyroid  Function Tests: No results for input(s): TSH, T4TOTAL, FREET4, T3FREE, THYROIDAB in the last 72 hours. Anemia Panel: No results for input(s): VITAMINB12, FOLATE, FERRITIN, TIBC, IRON, RETICCTPCT in the last 72 hours. Urine analysis:    Component Value Date/Time   COLORURINE YELLOW 05/05/2013 1957   APPEARANCEUR CLEAR 05/05/2013 1957   LABSPEC 1.010 05/05/2013 1957   PHURINE 5.5 05/05/2013 1957   GLUCOSEU NEGATIVE 05/05/2013 1957   HGBUR NEGATIVE 05/05/2013 1957   BILIRUBINUR NEGATIVE 05/05/2013 1957   KETONESUR NEGATIVE 05/05/2013 1957   PROTEINUR NEGATIVE 05/05/2013 1957   UROBILINOGEN 0.2 05/05/2013 1957   NITRITE NEGATIVE 05/05/2013 1957   LEUKOCYTESUR NEGATIVE 05/05/2013 1957   Sepsis Labs: @LABRCNTIP (procalcitonin:4,lacticidven:4) )No results found for this or any previous visit (from the past 240 hour(s)).   Radiological Exams on Admission: No results found.   Assessment/Plan   1. Symptomatic anemia  - Presents with progressive fatigue and lightheadedness in setting of rectal bleeding  and is found to have Hgb 6.2, down from 12.8 in 2015  - ED PA consulted GI and ordered 2 units RBC ordered for transfusion   - Keep NPO pending GI consultation, check post-transfusion H&H    2. Hypothyroidism  - Continue Synthroid    3. Depression  - Continue Cymbalta and Lexapro    DVT prophylaxis: SCDs Code Status: Full  Level of Care: Level of care: Med-Surg Family Communication: Husband updated at bedside Disposition Plan:  Patient is from: Home  Anticipated d/c is to: Home  Anticipated d/c date is: 5/19 or 08/08/20 Patient currently: Pending post-transfusion H&H, GI consultation  Consults called: GI  Admission status: Observation     Vianne Bulls, MD [facility]  08/06/2020, 11:13 PM

## 2020-08-06 NOTE — ED Notes (Signed)
ED TO INPATIENT HANDOFF REPORT  Name/Age/Gender Jill Olsen 55 y.o. female  Code Status Code Status History    Date Active Date Inactive Code Status Order ID Comments User Context   02/12/2013 0959 02/13/2013 1420 Full Code [id number]  Erby Pian, NP ED   Advance Care Planning Activity    Questions for Most Recent Historical Code Status (Order [id number])       Home/SNF/Other Home  Chief Complaint Symptomatic anemia [D64.9]  Level of Care/Admitting Diagnosis ED Disposition    ED Disposition Condition [place]: [facility] [[id number]]  Level of Care: Med-Surg [16]  Covid Evaluation: Asymptomatic Screening Protocol (No Symptoms)  Diagnosis: Symptomatic anemia [4982641]  Admitting Physician: Vianne Bulls [[id number]]  Attending Physician: Vianne Bulls [[id number]]       Medical History Past Medical History:  Diagnosis Date  . Alcohol abuse   . Depression   . Hyperlipidemia   . Hyperthyroidism    s/p radioactive iodine therapy. resolved.  . Secondary hypothyroidism     Allergies Allergies  Allergen Reactions  . Codeine Itching    Lips numb  . Dilaudid [Hydromorphone Hcl] Itching  . Fentanyl Itching    IV Location/Drains/Wounds Patient Lines/Drains/Airways Status    Active Line/Drains/Airways    Name Placement date Placement time Site Days   Peripheral IV 08/06/20 Left Antecubital 08/06/20  1855  Antecubital  less than 1   Incision - 3 Ports Abdomen 1: Right;Upper 2: Mid 3: Left;Lower 02/12/13  1319  -- 2732          Labs/Imaging Results for orders placed or performed during the hospital encounter of 08/06/20 (from the past 48 hour(s))  Comprehensive metabolic panel     Status: Abnormal   Collection Time: 08/06/20  6:44 PM  Result Value Ref Range   Sodium 139 135 - 145 mmol/L   Potassium 4.0 3.5 - 5.1 mmol/L   Chloride 104 98 - 111 mmol/L   CO2 24 22 - 32 mmol/L   Glucose, Bld 83 70 - 99 mg/dL    Comment:  Glucose reference range applies only to samples taken after fasting for at least 8 hours.   BUN 8 6 - 20 mg/dL   Creatinine, Ser 0.56 0.44 - 1.00 mg/dL   Calcium 8.9 8.9 - 10.3 mg/dL   Total Protein 7.5 6.5 - 8.1 g/dL   Albumin 3.7 3.5 - 5.0 g/dL   AST 50 (H) 15 - 41 U/L   ALT 31 0 - 44 U/L   Alkaline Phosphatase 73 38 - 126 U/L   Total Bilirubin 0.5 0.3 - 1.2 mg/dL   GFR, Estimated >60 >60 mL/min    Comment: (NOTE) Calculated using the CKD-EPI Creatinine Equation (2021)    Anion gap 11 5 - 15    Comment: Performed at [facility], [place] [address], [place], [place] [address]  CBC WITH DIFFERENTIAL     Status: Abnormal   Collection Time: 08/06/20  6:44 PM  Result Value Ref Range   WBC 5.7 4.0 - 10.5 K/uL   RBC 2.80 (L) 3.87 - 5.11 MIL/uL   Hemoglobin 6.2 (LL) 12.0 - 15.0 g/dL    Comment: REPEATED TO VERIFY THIS CRITICAL RESULT HAS VERIFIED AND BEEN CALLED TO RN HARRIS BY ALEXIS Marueno ON 05 18 2022 AT 1949, AND HAS BEEN READ BACK.     HCT 22.7 (L) 36.0 - 46.0 %   MCV 81.1 80.0 - 100.0 fL  MCH 22.1 (L) 26.0 - 34.0 pg   MCHC 27.3 (L) 30.0 - 36.0 g/dL   RDW 17.5 (H) 11.5 - 15.5 %   Platelets 326 150 - 400 K/uL   nRBC 0.0 0.0 - 0.2 %   Neutrophils Relative % 60 %   Neutro Abs 3.4 1.7 - 7.7 K/uL   Lymphocytes Relative 22 %   Lymphs Abs 1.2 0.7 - 4.0 K/uL   Monocytes Relative 15 %   Monocytes Absolute 0.9 0.1 - 1.0 K/uL   Eosinophils Relative 2 %   Eosinophils Absolute 0.1 0.0 - 0.5 K/uL   Basophils Relative 1 %   Basophils Absolute 0.1 0.0 - 0.1 K/uL   Immature Granulocytes 0 %   Abs Immature Granulocytes 0.02 0.00 - 0.07 K/uL    Comment: Performed at [facility], [place] [address] [place], [place] [address]  Protime-INR     Status: None   Collection Time: 08/06/20  6:44 PM  Result Value Ref Range   Prothrombin Time 13.4 11.4 - 15.2 seconds   INR 1.0 0.8 - 1.2    Comment: (NOTE) INR goal varies based on device and disease  states. Performed at [facility], [place] [address], [place], [place] [address]   Type and screen [place]     Status: None   Collection Time: 08/06/20  6:44 PM  Result Value Ref Range   ABO/RH(D) A POS    Antibody Screen NEG    Sample Expiration      08/09/2020,2359 Performed at [place] [address], [place], [place] [address]   POC occult blood, ED     Status: None   Collection Time: 08/06/20  7:07 PM  Result Value Ref Range   Fecal Occult Bld NEGATIVE NEGATIVE  I-Stat beta hCG blood, ED     Status: None   Collection Time: 08/06/20  7:12 PM  Result Value Ref Range   I-stat hCG, quantitative <5.0 <5 mIU/mL   Comment 3            Comment:   GEST. AGE      CONC.  (mIU/mL)   <=1 WEEK        5 - 50     2 WEEKS       50 - 500     3 WEEKS       100 - 10,000     4 WEEKS     1,000 - 30,000        FEMALE AND NON-PREGNANT FEMALE:     LESS THAN 5 mIU/mL    No results found.  Pending Labs Unresulted Labs (From admission, onward)          Start     Ordered   08/06/20 2130  ABO/Rh  Once,   R        08/06/20 2130   08/06/20 2011  Prepare RBC (crossmatch)  (Adult Blood Administration - PRBC)  Once,   R       Question Answer Comment  # of Units 2 units   Transfusion Indications Symptomatic Anemia   Number of Units to Keep Ahead NO units ahead   If emergent release call blood bank [facility] [phone]      08/06/20 2011          Vitals/Pain Today's Vitals   08/06/20 1921 08/06/20 1930 08/06/20 2000 08/06/20 2030  BP: (!) 156/79 (!) 154/76 (!) 163/68 (!) 158/80  Pulse: 82  80 88 85  Resp: 15 15 17 17   Temp: 98.7 F (37.1 C)     TempSrc: Oral     SpO2: 100% 100% 100% 100%    Isolation Precautions No active isolations  Medications Medications  0.9 %  sodium chloride infusion (has no administration in time range)    Mobility walks

## 2020-08-06 NOTE — ED Provider Notes (Signed)
[facility] DEPT Provider Note   CSN: [id number] Arrival date & time: 08/06/20  1759     History Chief Complaint  Patient presents with  . Low Hemoglobin    Jill Olsen is a 55 y.o. female who presents emergency department with chief complaint of low hemoglobin.  Patient states that she was having intermittent episodes of rectal bleeding generally before and after the week of her period starting in January.  She states that at least twice a day she would have episodes of urgency to defecate in which she would only pass bright red blood or clots and occasionally mixed in stool.  Over the past 2 months this has become a daily event.  She has had increasing weakness, exertional dyspnea and lightheadedness over the past 2 weeks.  She had a blood drawn at her doctor's office yesterday and had about a 5 g drop in her hemoglobin so was sent to the emergency department.  She denies any rectal pain, abdominal pain, unexplained weight loss.  She does have internal and external hemorrhoids.  HPI     Past Medical History:  Diagnosis Date  . Alcohol abuse   . Depression   . Hyperlipidemia   . Hyperthyroidism    s/p radioactive iodine therapy. resolved.  . Secondary hypothyroidism     There are no problems to display for this patient.   Past Surgical History:  Procedure Laterality Date  . ECTOPIC PREGNANCY SURGERY    . LAPAROSCOPIC APPENDECTOMY N/A 02/12/2013   Procedure: APPENDECTOMY LAPAROSCOPIC;  Surgeon: Gwenyth Ober, MD;  Location: [facility];  Service: General;  Laterality: N/A;  . TUBAL LIGATION       OB History   No obstetric history on file.     Family History  Problem Relation Age of Onset  . Colon polyps Father   . Heart disease Father   . Breast cancer Sister   . Crohn's disease Sister   . Ovarian cancer Maternal Aunt   . Thyroid disease Mother   . Heart disease Mother   . Depression Maternal Grandfather   . Glaucoma Maternal  Grandfather     Social History   Tobacco Use  . Smoking status: Former Research scientist (life sciences)  . Smokeless tobacco: Never Used  Substance Use Topics  . Alcohol use: Yes    Alcohol/week: 12.0 standard drinks    Types: 12 Cans of beer per week  . Drug use: No    Home Medications Prior to Admission medications   Medication Sig Start Date End Date Taking? Authorizing Provider  DULoxetine (CYMBALTA) 60 MG capsule Take 60 mg by mouth daily.    [provider]  ibuprofen (ADVIL,MOTRIN) 200 MG tablet Take 800 mg by mouth every 6 (six) hours as needed for moderate pain.    [provider]  levothyroxine (SYNTHROID, LEVOTHROID) 175 MCG tablet Take 175 mcg by mouth daily.    [provider]  oxyCODONE-acetaminophen (PERCOCET/ROXICET) 5-325 MG per tablet Take 1-2 tablets by mouth every 4 (four) hours as needed for severe pain. 05/06/13   Muthersbaugh, Jarrett Soho, PA-C  rosuvastatin (CRESTOR) 20 MG tablet Take 20 mg by mouth daily.    [provider]    Allergies    Codeine, Dilaudid [hydromorphone hcl], and Fentanyl  Review of Systems   Review of Systems Ten systems reviewed and are negative for acute change, except as noted in the HPI.   Physical Exam Updated Vital Signs BP (!) 151/83   Pulse 88  Temp 98.5 F (36.9 C)   Resp 18   SpO2 99%   Physical Exam Vitals and nursing note reviewed.  Constitutional:      General: She is not in acute distress.    Appearance: She is well-developed. She is not diaphoretic.  HENT:     Head: Normocephalic and atraumatic.  Eyes:     General: No scleral icterus.    Conjunctiva/sclera: Conjunctivae normal.  Cardiovascular:     Rate and Rhythm: Normal rate and regular rhythm.     Heart sounds: Normal heart sounds. No murmur heard. No friction rub. No gallop.   Pulmonary:     Effort: Pulmonary effort is normal. No respiratory distress.     Breath sounds: Normal breath sounds.  Abdominal:     General: Bowel sounds are normal.  There is no distension.     Palpations: Abdomen is soft. There is no mass.     Tenderness: There is no abdominal tenderness. There is no guarding.  Genitourinary:    Comments: Digital Rectal Exam reveals sphincter with good tone.. No masses or fissures. Stool color is brown with no overt blood. Exam chaperoned by husband at bedside.  Musculoskeletal:     Cervical back: Normal range of motion.  Skin:    General: Skin is warm and dry.  Neurological:     Mental Status: She is alert and oriented to person, place, and time.  Psychiatric:        Behavior: Behavior normal.     ED Results / Procedures / Treatments   Labs (all labs ordered are listed, but only abnormal results are displayed) Labs Reviewed  COMPREHENSIVE METABOLIC PANEL  CBC WITH DIFFERENTIAL/PLATELET  PROTIME-INR  I-STAT BETA HCG BLOOD, ED ([facility], [facility], [facility] ONLY)  TYPE AND SCREEN    EKG None  Radiology No results found.  Procedures .Critical Care Performed by: Margarita Mail, PA-C Authorized by: Margarita Mail, PA-C   Critical care provider statement:    Critical care time (minutes):  40   Critical care was necessary to treat or prevent imminent or life-threatening deterioration of the following conditions:  Circulatory failure   Critical care was time spent personally by me on the following activities:  Discussions with consultants, evaluation of patient's response to treatment, examination of patient, ordering and performing treatments and interventions, ordering and review of laboratory studies, ordering and review of radiographic studies, pulse oximetry, re-evaluation of patient's condition, obtaining history from patient or surrogate and review of old charts     Medications Ordered in ED Medications - No data to display  ED Course  I have reviewed the triage vital signs and the nursing notes.  Pertinent labs & imaging results that were available during my care of the patient were reviewed by me and  considered in my medical decision making (see chart for details).  Clinical Course as of 08/06/20 2337  Wed Aug 06, 2020  2040 BUN: 8 [AH]    Clinical Course User Index [AH] Margarita Mail, PA-C   MDM Rules/Calculators/A&P                         55 year old female who presents with complaint of low hemoglobin and rectal bleeding.  I have ordered and reviewed labs which include CBC which shows a hemoglobin of 6.2, CMP without elevated BUN.  PT/INR within normal limits, negative pregnancy test, negative occult stool test.  Patient's type and screen shows a positive blood. Patient will be admitted  by Dr. Myna Hidalgo for blood transfusion.  I discussed the case with Dr. Fuller Plan of [facility] gastroenterology who recommends the patient have both a colonoscopy and endoscopy.  Final Clinical Impression(s) / ED Diagnoses Final diagnoses:  None    Rx / DC Orders ED Discharge Orders    None       Margarita Mail, PA-C 08/06/20 2338    Milton Ferguson, MD 08/08/20 1546

## 2020-08-07 DIAGNOSIS — D62 Acute posthemorrhagic anemia: Secondary | ICD-10-CM | POA: Diagnosis present

## 2020-08-07 DIAGNOSIS — K3189 Other diseases of stomach and duodenum: Secondary | ICD-10-CM | POA: Diagnosis not present

## 2020-08-07 DIAGNOSIS — K625 Hemorrhage of anus and rectum: Secondary | ICD-10-CM | POA: Diagnosis not present

## 2020-08-07 DIAGNOSIS — K922 Gastrointestinal hemorrhage, unspecified: Secondary | ICD-10-CM | POA: Diagnosis not present

## 2020-08-07 DIAGNOSIS — E785 Hyperlipidemia, unspecified: Secondary | ICD-10-CM | POA: Diagnosis present

## 2020-08-07 DIAGNOSIS — K644 Residual hemorrhoidal skin tags: Secondary | ICD-10-CM | POA: Diagnosis present

## 2020-08-07 DIAGNOSIS — E890 Postprocedural hypothyroidism: Secondary | ICD-10-CM | POA: Diagnosis present

## 2020-08-07 DIAGNOSIS — F32A Depression, unspecified: Secondary | ICD-10-CM | POA: Diagnosis present

## 2020-08-07 DIAGNOSIS — D509 Iron deficiency anemia, unspecified: Secondary | ICD-10-CM | POA: Diagnosis present

## 2020-08-07 DIAGNOSIS — Z79890 Hormone replacement therapy (postmenopausal): Secondary | ICD-10-CM | POA: Diagnosis not present

## 2020-08-07 DIAGNOSIS — E876 Hypokalemia: Secondary | ICD-10-CM | POA: Diagnosis present

## 2020-08-07 DIAGNOSIS — Z79899 Other long term (current) drug therapy: Secondary | ICD-10-CM | POA: Diagnosis not present

## 2020-08-07 DIAGNOSIS — D649 Anemia, unspecified: Secondary | ICD-10-CM | POA: Diagnosis not present

## 2020-08-07 DIAGNOSIS — K319 Disease of stomach and duodenum, unspecified: Secondary | ICD-10-CM | POA: Diagnosis present

## 2020-08-07 DIAGNOSIS — K648 Other hemorrhoids: Secondary | ICD-10-CM | POA: Diagnosis present

## 2020-08-07 DIAGNOSIS — Z20822 Contact with and (suspected) exposure to covid-19: Secondary | ICD-10-CM | POA: Diagnosis present

## 2020-08-07 DIAGNOSIS — Z87891 Personal history of nicotine dependence: Secondary | ICD-10-CM | POA: Diagnosis not present

## 2020-08-07 DIAGNOSIS — K5730 Diverticulosis of large intestine without perforation or abscess without bleeding: Secondary | ICD-10-CM | POA: Diagnosis present

## 2020-08-07 DIAGNOSIS — F1010 Alcohol abuse, uncomplicated: Secondary | ICD-10-CM | POA: Diagnosis present

## 2020-08-07 DIAGNOSIS — K257 Chronic gastric ulcer without hemorrhage or perforation: Secondary | ICD-10-CM | POA: Diagnosis present

## 2020-08-07 LAB — MAGNESIUM: Magnesium: 2 mg/dL (ref 1.7–2.4)

## 2020-08-07 LAB — BASIC METABOLIC PANEL
Anion gap: 10 (ref 5–15)
BUN: 7 mg/dL (ref 6–20)
CO2: 25 mmol/L (ref 22–32)
Calcium: 8.8 mg/dL — ABNORMAL LOW (ref 8.9–10.3)
Chloride: 104 mmol/L (ref 98–111)
Creatinine, Ser: 0.49 mg/dL (ref 0.44–1.00)
GFR, Estimated: >60 mL/min (ref >60)
Glucose, Bld: 95 mg/dL (ref 70–99)
Potassium: 3.4 mmol/L — ABNORMAL LOW (ref 3.5–5.1)
Sodium: 139 mmol/L (ref 135–145)

## 2020-08-07 LAB — HEMOGLOBIN AND HEMATOCRIT, BLOOD
HCT: 29.4 % — ABNORMAL LOW (ref 36.0–46.0)
HCT: 32.8 % — ABNORMAL LOW (ref 36.0–46.0)
Hemoglobin: 8.9 g/dL — ABNORMAL LOW (ref 12.0–15.0)
Hemoglobin: 9.8 g/dL — ABNORMAL LOW (ref 12.0–15.0)

## 2020-08-07 LAB — PHOSPHORUS: Phosphorus: 3.6 mg/dL (ref 2.5–4.6)

## 2020-08-07 LAB — HIV ANTIBODY (ROUTINE TESTING W REFLEX): HIV Screen 4th Generation wRfx: NONREACTIVE

## 2020-08-07 LAB — RETICULOCYTES
Immature Retic Fract: 25.8 % — ABNORMAL HIGH (ref 2.3–15.9)
RBC.: 3.59 MIL/uL — ABNORMAL LOW (ref 3.87–5.11)
Retic Count, Absolute: 59.2 10*3/uL (ref 19.0–186.0)
Retic Ct Pct: 1.7 % (ref 0.4–3.1)

## 2020-08-07 LAB — CBC
HCT: 30.4 % — ABNORMAL LOW (ref 36.0–46.0)
Hemoglobin: 9.3 g/dL — ABNORMAL LOW (ref 12.0–15.0)
MCH: 25 pg — ABNORMAL LOW (ref 26.0–34.0)
MCHC: 30.6 g/dL (ref 30.0–36.0)
MCV: 81.7 fL (ref 80.0–100.0)
Platelets: 303 10*3/uL (ref 150–400)
RBC: 3.72 MIL/uL — ABNORMAL LOW (ref 3.87–5.11)
RDW: 17.3 % — ABNORMAL HIGH (ref 11.5–15.5)
WBC: 4.7 10*3/uL (ref 4.0–10.5)
nRBC: 0 % (ref 0.0–0.2)

## 2020-08-07 LAB — VITAMIN B12: Vitamin B-12: 253 pg/mL (ref 180–914)

## 2020-08-07 LAB — IRON AND TIBC
Iron: 61 ug/dL (ref 28–170)
Saturation Ratios: 14 % (ref 10.4–31.8)
TIBC: 432 ug/dL (ref 250–450)
UIBC: 371 ug/dL

## 2020-08-07 LAB — FERRITIN: Ferritin: 5 ng/mL — ABNORMAL LOW (ref 11–307)

## 2020-08-07 LAB — FOLATE: Folate: 12 ng/mL (ref >5.9)

## 2020-08-07 MED ORDER — LORAZEPAM 2 MG/ML IJ SOLN: 0.0000 mg | Freq: Two times a day (BID) | INTRAMUSCULAR | Status: DC

## 2020-08-07 MED ORDER — POTASSIUM CHLORIDE CRYS ER 20 MEQ PO TBCR
40.0000 meq | EXTENDED_RELEASE_TABLET | Freq: Once | ORAL | Status: AC
  Administered 2020-08-07: 40 meq via ORAL
  Filled 2020-08-07: qty 2

## 2020-08-07 MED ORDER — ADULT MULTIVITAMIN W/MINERALS CH
1.0000 | ORAL_TABLET | Freq: Every day | ORAL | Status: DC
  Administered 2020-08-07: 1 via ORAL
  Filled 2020-08-07 (×2): qty 1

## 2020-08-07 MED ORDER — FOLIC ACID 1 MG PO TABS
1.0000 mg | ORAL_TABLET | Freq: Every day | ORAL | Status: DC
  Administered 2020-08-07 – 2020-08-08 (×2): 1 mg via ORAL
  Filled 2020-08-07 (×2): qty 1

## 2020-08-07 MED ORDER — THIAMINE HCL 100 MG PO TABS
100.0000 mg | ORAL_TABLET | Freq: Every day | ORAL | Status: DC
  Administered 2020-08-07 – 2020-08-08 (×2): 100 mg via ORAL
  Filled 2020-08-07 (×2): qty 1

## 2020-08-07 MED ORDER — LORAZEPAM 1 MG PO TABS: 1.0000 mg | ORAL_TABLET | ORAL | Status: DC | PRN

## 2020-08-07 MED ORDER — PEG-KCL-NACL-NASULF-NA ASC-C 100 G PO SOLR
0.5000 | Freq: Once | ORAL | Status: AC
  Administered 2020-08-08: 100 g via ORAL

## 2020-08-07 MED ORDER — LORAZEPAM 2 MG/ML IJ SOLN: 1.0000 mg | INTRAMUSCULAR | Status: DC | PRN

## 2020-08-07 MED ORDER — THIAMINE HCL 100 MG/ML IJ SOLN: 100.0000 mg | Freq: Every day | INTRAMUSCULAR | Status: DC

## 2020-08-07 MED ORDER — LORAZEPAM 2 MG/ML IJ SOLN: 0.0000 mg | Freq: Four times a day (QID) | INTRAMUSCULAR | Status: DC

## 2020-08-07 MED ORDER — PEG-KCL-NACL-NASULF-NA ASC-C 100 G PO SOLR
0.5000 | Freq: Once | ORAL | Status: AC
  Administered 2020-08-07: 100 g via ORAL
  Filled 2020-08-07: qty 1

## 2020-08-07 MED ORDER — SODIUM CHLORIDE 0.9 % IV SOLN: INTRAVENOUS | Status: DC

## 2020-08-07 MED ORDER — PEG-KCL-NACL-NASULF-NA ASC-C 100 G PO SOLR: 1.0000 | Freq: Once | ORAL | Status: DC

## 2020-08-07 NOTE — H&P (View-Only) (Signed)
Consultation  Referring Provider:[facility]/ Rodena Piety Primary Care Physician:  Reynold Bowen, MD Primary Gastroenterologist:  Dr. Ardis Hughs   Reason for Consultation: Anemia, rectal bleeding  HPI: Jill Olsen is a 55 y.o. female, known remotely to Dr. Ardis Hughs from endoscopic evaluation done in 2013 due to complaints of intermittent rectal bleeding, and right upper quadrant pain.  Colonoscopy with finding of a single 3 to 5 mm sigmoid polyp which was removed and found to be hyperplastic and external hemorrhoids, otherwise negative exam.  The EGD showed moderate gastritis and biopsies pertinent for inactive gastritis no evidence of intestinal metaplasia, no  H. Pylori.  Patient says she had labs done as an outpatient as she is to follow-up with her PCP next week.  As part of that a CBC was done and she was noted to be profoundly anemic and referred to the emergency room. On arrival here hemoglobin 6.2/hematocrit 22.7, MCV 81, platelets 326 INR 1.0, LFTs within normal limits with the exception of AST of 50  No abdominal imaging done.  She has been transfused 2 units and hemoglobin is 8.9 this morning  Patient relates that she has been seeing blood intermittently with her bowel movements since January.  She relates these episodes of bleeding to be occurring either prior to or after her menstrual period.  Gradually over the past couple of months the bleeding has increased and she is now seeing blood with almost every bowel movement.  She says she has normal-appearing stool which will be associated with bright red blood and some mucus.  Occasionally having loose or mushy stools with blood and occasionally having urge for a bowel movement and just passing a small amount of blood or clot. She does have fairly heavy periods as well though has not had any changes recently.  She says her menses are usually heavy for 2 days, and will last for a total of 7 days.  She has not been told that she has had  fibroids. She has no current complaints of abdominal discomfort, no heartburn or indigestion no nausea or vomiting.  Appetite has been fine, weight has been stable. No regular aspirin or NSAIDs. She does drink heavily on a chronic basis generally a 12 pack/day and has been doing so for many years. Family history negative for GI disease as far she is aware. No prior history of anemia that she is aware of.  Medical issues include depression, and iatrogenic hypothyroidism.    Past Medical History:  Diagnosis Date  . Alcohol abuse   . Depression   . Hyperlipidemia   . Hyperthyroidism    s/p radioactive iodine therapy. resolved.  . Secondary hypothyroidism     Past Surgical History:  Procedure Laterality Date  . ECTOPIC PREGNANCY SURGERY    . LAPAROSCOPIC APPENDECTOMY N/A 02/12/2013   Procedure: APPENDECTOMY LAPAROSCOPIC;  Surgeon: Gwenyth Ober, MD;  Location: [place];  Service: General;  Laterality: N/A;  . TUBAL LIGATION      Prior to Admission medications   Medication Sig Start Date End Date Taking? Authorizing Provider  acetaminophen (TYLENOL) 500 MG tablet Take 500 mg by mouth every 6 (six) hours as needed for moderate pain.   Yes [provider]  DULoxetine (CYMBALTA) 60 MG capsule Take 60 mg by mouth daily.   Yes [provider]  escitalopram (LEXAPRO) 10 MG tablet Take 1 tablet by mouth daily. 07/25/20  Yes [provider]  levothyroxine (UNITHROID) 125 MCG tablet Take 125 mcg by mouth daily  before breakfast.   Yes [provider]  REPATHA SURECLICK 595 MG/ML SOAJ Inject 140 mg into the skin every 14 (fourteen) days. 07/25/20  Yes [provider]  rosuvastatin (CRESTOR) 20 MG tablet Take 20 mg by mouth daily.   Yes [provider]  levothyroxine (SYNTHROID, LEVOTHROID) 175 MCG tablet Take 175 mcg by mouth daily. Patient not taking: Reported on 08/06/2020    [provider]  oxyCODONE-acetaminophen (PERCOCET/ROXICET)  5-325 MG per tablet Take 1-2 tablets by mouth every 4 (four) hours as needed for severe pain. Patient not taking: Reported on 08/06/2020 05/06/13   Muthersbaugh, Jarrett Soho, PA-C    Current Facility-Administered Medications  Medication Dose Route Frequency Provider Last Rate Last Admin  . acetaminophen (TYLENOL) tablet 650 mg  650 mg Oral Q6H PRN Opyd, Ilene Qua, MD   650 mg at 08/07/20 0208   Or  . acetaminophen (TYLENOL) suppository 650 mg  650 mg Rectal Q6H PRN Opyd, Ilene Qua, MD      . DULoxetine (CYMBALTA) DR capsule 60 mg  60 mg Oral Daily Opyd, Ilene Qua, MD      . escitalopram (LEXAPRO) tablet 10 mg  10 mg Oral Daily Opyd, Ilene Qua, MD      . levothyroxine (SYNTHROID) tablet 125 mcg  125 mcg Oral Q0600 Opyd, Ilene Qua, MD      . ondansetron (ZOFRAN) tablet 4 mg  4 mg Oral Q6H PRN Opyd, Ilene Qua, MD       Or  . ondansetron (ZOFRAN) injection 4 mg  4 mg Intravenous Q6H PRN Opyd, Ilene Qua, MD      . rosuvastatin (CRESTOR) tablet 20 mg  20 mg Oral Daily Opyd, Ilene Qua, MD        Allergies as of 08/06/2020 - Review Complete 08/06/2020  Allergen Reaction Noted  . Codeine Itching 11/01/2011  . Dilaudid [hydromorphone hcl] Itching 02/12/2013  . Fentanyl Itching 02/12/2013    Family History  Problem Relation Age of Onset  . Colon polyps Father   . Heart disease Father   . Breast cancer Sister   . Crohn's disease Sister   . Ovarian cancer Maternal Aunt   . Thyroid disease Mother   . Heart disease Mother   . Depression Maternal Grandfather   . Glaucoma Maternal Grandfather     Social History   Socioeconomic History  . Marital status: Married    Spouse name: Not on file  . Number of children: 3  . Years of education: Not on file  . Highest education level: Not on file  Occupational History  . Occupation: Therapist, art  Tobacco Use  . Smoking status: Former Research scientist (life sciences)  . Smokeless tobacco: Never Used  Substance and Sexual Activity  . Alcohol use: Yes    Alcohol/week:  12.0 standard drinks    Types: 12 Cans of beer per week  . Drug use: No  . Sexual activity: Yes    Comment: post tubal ligation   Review of Systems: Pertinent positive and negative review of systems were noted in the above HPI section.  All other review of systems was otherwise negative.  Physical Exam: Vital signs in last 24 hours: Temp:  [98 F (36.7 C)-99.2 F (37.3 C)] 98.2 F (36.8 C) (05/19 0627) Pulse Rate:  [79-96] 80 (05/19 0627) Resp:  [15-18] 15 (05/19 0627) BP: (142-174)/(59-83) 174/77 (05/19 0627) SpO2:  [97 %-100 %] 98 % (05/19 0627) Last BM Date: 08/06/20 General:   Alert,  Well-developed, well-nourished, pleasant and cooperative  in NAD husband at bedside Head:  Normocephalic and atraumatic. Eyes:  Sclera clear, no icterus.   Conjunctiva pink. Ears:  Normal auditory acuity. Nose:  No deformity, discharge,  or lesions. Mouth:  No deformity or lesions.   Neck:  Supple; no masses or thyromegaly. Lungs:  Clear throughout to auscultation.   No wheezes, crackles, or rhonchi. Heart:  Regular rate and rhythm; no murmurs, clicks, rubs,  or gallops. Abdomen:  Soft,nontender, BS active,nonpalp mass or hsm.   Rectal: Heme-negative on admission Msk:  Symmetrical without gross deformities. . Pulses:  Normal pulses noted. Extremities:  Without clubbing or edema. Neurologic:  Alert and  oriented x4;  grossly normal neurologically. Skin:  Intact without significant lesions or rashes.. Psych:  Alert and cooperative. Normal mood and affect.  Intake/Output from previous day: 05/18 0701 - 05/19 0700 In: 1117.6 [P.O.:240; I.V.:361.6; Blood:516] Out: 400 [Urine:400] Intake/Output this shift: No intake/output data recorded.  Lab Results: Recent Labs    08/06/20 1844 08/07/20 0726  WBC 5.7  --   HGB 6.2* 8.9*  HCT 22.7* 29.4*  PLT 326  --    BMET Recent Labs    08/06/20 1844 08/07/20 0726  NA 139 139  K 4.0 3.4*  CL 104 104  CO2 24 25  GLUCOSE 83 95  BUN 8 7   CREATININE 0.56 0.49  CALCIUM 8.9 8.8*   LFT Recent Labs    08/06/20 1844  PROT 7.5  ALBUMIN 3.7  AST 50*  ALT 31  ALKPHOS 73  BILITOT 0.5   PT/INR Recent Labs    08/06/20 1844  LABPROT 13.4  INR 1.0      IMPRESSION:  #89  55 year old white female with profound normocytic anemia and complaints of rectal bleeding over the past 5 months noticing bright red blood with most bowel movements Hemoglobin 6.2 on admission, up to 8.9 today post transfusions  Previously documented external hemorrhoids at colonoscopy 2013 otherwise negative exam stable and small hyperplastic polyp  Etiology of bleeding is not clear, rule out neoplasm, proctitis, colitis, internal hemorrhoids.  #2 anemia-Baseline hemoglobin not known, unclear whether she has had a chronic component to the anemia. Rule out iron deficiency, rule out B12 folate deficiency  #3 heavy EtOH use-chronic, no evidence for cirrhosis by labs Will need abdominal imaging  #4 history of depression #5.  Hypothyroidism status post radioactive iodine  PLAN: Anemia panel today Serial hemoglobins Transfuse to keep hemoglobin 7 or above Patient will be scheduled for colonoscopy and will also schedule for EGD given history of chronic EtOH use, concerns for possible chronic gastropathy etc. Procedures will be scheduled with Dr. Carlean Purl for tomorrow 08/08/2020.  Procedures were discussed in detail with the patient including indications risk and benefits and she is agreeable to proceed.  Clear liquid diet today, n.p.o. in a.m. Bowel prep to start this evening Thank you will follow with you   Amy EsterwoodPA-C  08/07/2020, 9:10 AM  Gastroenterology attending: I have personally interviewed the patient reviewed the chart and seen her as well. Lab Results  Component Value Date   FERRITIN 5 (L) 08/07/2020   Lab Results  Component Value Date   VITAMINB12 253 08/07/2020    So she has an iron deficiency anemia which we will  evaluate as above.  Dr. Tarri Glenn will perform the procedures we have had a schedule change on that.  I explained that to the patient and her husband.  Gatha Mayer, MD, [facility] 08/07/2020 4:06 PM

## 2020-08-07 NOTE — Consult Note (Addendum)
  Consultation  Referring Provider:[facility]/ Mathews Primary Care Physician:  South, Stephen, MD Primary Gastroenterologist:  Dr. Jacobs   Reason for Consultation: Anemia, rectal bleeding  HPI: Jill Olsen is a 54 y.o. female, known remotely to Dr. Jacobs from endoscopic evaluation done in 2013 due to complaints of intermittent rectal bleeding, and right upper quadrant pain.  Colonoscopy with finding of a single 3 to 5 mm sigmoid polyp which was removed and found to be hyperplastic and external hemorrhoids, otherwise negative exam.  The EGD showed moderate gastritis and biopsies pertinent for inactive gastritis no evidence of intestinal metaplasia, no  H. Pylori.  Patient says she had labs done as an outpatient as she is to follow-up with her PCP next week.  As part of that a CBC was done and she was noted to be profoundly anemic and referred to the emergency room. On arrival here hemoglobin 6.2/hematocrit 22.7, MCV 81, platelets 326 INR 1.0, LFTs within normal limits with the exception of AST of 50  No abdominal imaging done.  She has been transfused 2 units and hemoglobin is 8.9 this morning  Patient relates that she has been seeing blood intermittently with her bowel movements since January.  She relates these episodes of bleeding to be occurring either prior to or after her menstrual period.  Gradually over the past couple of months the bleeding has increased and she is now seeing blood with almost every bowel movement.  She says she has normal-appearing stool which will be associated with bright red blood and some mucus.  Occasionally having loose or mushy stools with blood and occasionally having urge for a bowel movement and just passing a small amount of blood or clot. She does have fairly heavy periods as well though has not had any changes recently.  She says her menses are usually heavy for 2 days, and will last for a total of 7 days.  She has not been told that she has had  fibroids. She has no current complaints of abdominal discomfort, no heartburn or indigestion no nausea or vomiting.  Appetite has been fine, weight has been stable. No regular aspirin or NSAIDs. She does drink heavily on a chronic basis generally a 12 pack/day and has been doing so for many years. Family history negative for GI disease as far she is aware. No prior history of anemia that she is aware of.  Medical issues include depression, and iatrogenic hypothyroidism.    Past Medical History:  Diagnosis Date  . Alcohol abuse   . Depression   . Hyperlipidemia   . Hyperthyroidism    s/p radioactive iodine therapy. resolved.  . Secondary hypothyroidism     Past Surgical History:  Procedure Laterality Date  . ECTOPIC PREGNANCY SURGERY    . LAPAROSCOPIC APPENDECTOMY N/A 02/12/2013   Procedure: APPENDECTOMY LAPAROSCOPIC;  Surgeon: James O Wyatt, MD;  Location: [facility] OR;  Service: General;  Laterality: N/A;  . TUBAL LIGATION      Prior to Admission medications   Medication Sig Start Date End Date Taking? Authorizing Provider  acetaminophen (TYLENOL) 500 MG tablet Take 500 mg by mouth every 6 (six) hours as needed for moderate pain.   Yes [provider]  DULoxetine (CYMBALTA) 60 MG capsule Take 60 mg by mouth daily.   Yes [provider]  escitalopram (LEXAPRO) 10 MG tablet Take 1 tablet by mouth daily. 07/25/20  Yes [provider]  levothyroxine (UNITHROID) 125 MCG tablet Take 125 mcg by mouth daily   before breakfast.   Yes [provider]  REPATHA SURECLICK 595 MG/ML SOAJ Inject 140 mg into the skin every 14 (fourteen) days. 07/25/20  Yes [provider]  rosuvastatin (CRESTOR) 20 MG tablet Take 20 mg by mouth daily.   Yes [provider]  levothyroxine (SYNTHROID, LEVOTHROID) 175 MCG tablet Take 175 mcg by mouth daily. Patient not taking: Reported on 08/06/2020    [provider]  oxyCODONE-acetaminophen (PERCOCET/ROXICET)  5-325 MG per tablet Take 1-2 tablets by mouth every 4 (four) hours as needed for severe pain. Patient not taking: Reported on 08/06/2020 05/06/13   Muthersbaugh, Jarrett Soho, PA-C    Current Facility-Administered Medications  Medication Dose Route Frequency Provider Last Rate Last Admin  . acetaminophen (TYLENOL) tablet 650 mg  650 mg Oral Q6H PRN Opyd, Ilene Qua, MD   650 mg at 08/07/20 0208   Or  . acetaminophen (TYLENOL) suppository 650 mg  650 mg Rectal Q6H PRN Opyd, Ilene Qua, MD      . DULoxetine (CYMBALTA) DR capsule 60 mg  60 mg Oral Daily Opyd, Ilene Qua, MD      . escitalopram (LEXAPRO) tablet 10 mg  10 mg Oral Daily Opyd, Ilene Qua, MD      . levothyroxine (SYNTHROID) tablet 125 mcg  125 mcg Oral Q0600 Opyd, Ilene Qua, MD      . ondansetron (ZOFRAN) tablet 4 mg  4 mg Oral Q6H PRN Opyd, Ilene Qua, MD       Or  . ondansetron (ZOFRAN) injection 4 mg  4 mg Intravenous Q6H PRN Opyd, Ilene Qua, MD      . rosuvastatin (CRESTOR) tablet 20 mg  20 mg Oral Daily Opyd, Ilene Qua, MD        Allergies as of 08/06/2020 - Review Complete 08/06/2020  Allergen Reaction Noted  . Codeine Itching 11/01/2011  . Dilaudid [hydromorphone hcl] Itching 02/12/2013  . Fentanyl Itching 02/12/2013    Family History  Problem Relation Age of Onset  . Colon polyps Father   . Heart disease Father   . Breast cancer Sister   . Crohn's disease Sister   . Ovarian cancer Maternal Aunt   . Thyroid disease Mother   . Heart disease Mother   . Depression Maternal Grandfather   . Glaucoma Maternal Grandfather     Social History   Socioeconomic History  . Marital status: Married    Spouse name: Not on file  . Number of children: 3  . Years of education: Not on file  . Highest education level: Not on file  Occupational History  . Occupation: Therapist, art  Tobacco Use  . Smoking status: Former Research scientist (life sciences)  . Smokeless tobacco: Never Used  Substance and Sexual Activity  . Alcohol use: Yes    Alcohol/week:  12.0 standard drinks    Types: 12 Cans of beer per week  . Drug use: No  . Sexual activity: Yes    Comment: post tubal ligation   Review of Systems: Pertinent positive and negative review of systems were noted in the above HPI section.  All other review of systems was otherwise negative.  Physical Exam: Vital signs in last 24 hours: Temp:  [98 F (36.7 C)-99.2 F (37.3 C)] 98.2 F (36.8 C) (05/19 0627) Pulse Rate:  [79-96] 80 (05/19 0627) Resp:  [15-18] 15 (05/19 0627) BP: (142-174)/(59-83) 174/77 (05/19 0627) SpO2:  [97 %-100 %] 98 % (05/19 0627) Last BM Date: 08/06/20 General:   Alert,  Well-developed, well-nourished, pleasant and cooperative  in NAD husband at bedside Head:  Normocephalic and atraumatic. Eyes:  Sclera clear, no icterus.   Conjunctiva pink. Ears:  Normal auditory acuity. Nose:  No deformity, discharge,  or lesions. Mouth:  No deformity or lesions.   Neck:  Supple; no masses or thyromegaly. Lungs:  Clear throughout to auscultation.   No wheezes, crackles, or rhonchi. Heart:  Regular rate and rhythm; no murmurs, clicks, rubs,  or gallops. Abdomen:  Soft,nontender, BS active,nonpalp mass or hsm.   Rectal: Heme-negative on admission Msk:  Symmetrical without gross deformities. . Pulses:  Normal pulses noted. Extremities:  Without clubbing or edema. Neurologic:  Alert and  oriented x4;  grossly normal neurologically. Skin:  Intact without significant lesions or rashes.. Psych:  Alert and cooperative. Normal mood and affect.  Intake/Output from previous day: 05/18 0701 - 05/19 0700 In: 1117.6 [P.O.:240; I.V.:361.6; Blood:516] Out: 400 [Urine:400] Intake/Output this shift: No intake/output data recorded.  Lab Results: Recent Labs    08/06/20 1844 08/07/20 0726  WBC 5.7  --   HGB 6.2* 8.9*  HCT 22.7* 29.4*  PLT 326  --    BMET Recent Labs    08/06/20 1844 08/07/20 0726  NA 139 139  K 4.0 3.4*  CL 104 104  CO2 24 25  GLUCOSE 83 95  BUN 8 7   CREATININE 0.56 0.49  CALCIUM 8.9 8.8*   LFT Recent Labs    08/06/20 1844  PROT 7.5  ALBUMIN 3.7  AST 50*  ALT 31  ALKPHOS 73  BILITOT 0.5   PT/INR Recent Labs    08/06/20 1844  LABPROT 13.4  INR 1.0      IMPRESSION:  #1  54-year-old white female with profound normocytic anemia and complaints of rectal bleeding over the past 5 months noticing bright red blood with most bowel movements Hemoglobin 6.2 on admission, up to 8.9 today post transfusions  Previously documented external hemorrhoids at colonoscopy 2013 otherwise negative exam stable and small hyperplastic polyp  Etiology of bleeding is not clear, rule out neoplasm, proctitis, colitis, internal hemorrhoids.  #2 anemia-Baseline hemoglobin not known, unclear whether she has had a chronic component to the anemia. Rule out iron deficiency, rule out B12 folate deficiency  #3 heavy EtOH use-chronic, no evidence for cirrhosis by labs Will need abdominal imaging  #4 history of depression #5.  Hypothyroidism status post radioactive iodine  PLAN: Anemia panel today Serial hemoglobins Transfuse to keep hemoglobin 7 or above Patient will be scheduled for colonoscopy and will also schedule for EGD given history of chronic EtOH use, concerns for possible chronic gastropathy etc. Procedures will be scheduled with Dr. Jaylaa Gallion for tomorrow 08/08/2020.  Procedures were discussed in detail with the patient including indications risk and benefits and she is agreeable to proceed.  Clear liquid diet today, n.p.o. in a.m. Bowel prep to start this evening Thank you will follow with you   Amy EsterwoodPA-C  08/07/2020, 9:10 AM  Gastroenterology attending: I have personally interviewed the patient reviewed the chart and seen her as well. Lab Results  Component Value Date   FERRITIN 5 (L) 08/07/2020   Lab Results  Component Value Date   VITAMINB12 253 08/07/2020    So she has an iron deficiency anemia which we will  evaluate as above.  Dr. Beavers will perform the procedures we have had a schedule change on that.  I explained that to the patient and her husband.  Denym Rahimi E. Magic Mohler, MD, FACG [facility] 08/07/2020 4:06 PM    

## 2020-08-07 NOTE — TOC Initial Note (Signed)
Transition of Care [facility]) - Initial/Assessment Note    Patient Details  Name: Jill Olsen MRN: [id number] Date of Birth: 22-Feb-1966  [facility]) CM/SW Contact:    Lennart Pall, LCSW Phone Number: 08/07/2020, 2:11 PM  Clinical Narrative:                 Met with pt and spouse (with patient's approval) today to discuss referral for TOC to address substance use/ abuse concerns.  Pt very accepting and notes that she and her spouse "were talking about that on the way here yesterday... he (spouse) said 'what are you gonna do if they tell you you have to stop drinking beer?' " Pt and spouse are agreed that she does consume a greater amount of alcohol "than I should."  She has never tried to quit drinking or sought help for her drinking in the past.  She notes that she is fully aware that the alcohol is a likely source for her GI problems and anticipates the MDs will recommend that she stop.  She is open about not knowing how difficult this might be for her but she is willing to do this to improve her overall health. We discussed both inpatient and outpatient programs and she is somewhat familiar with a few.  She and husband are agreed that they will pursue assistance with her cessation if they determine that it is needed.  Review and handout information left with pt and encouraged her to have RN contact me if they have any questions while here.  Both grateful for the info and are hopeful at this point.  Expected Discharge Plan: Home/Self Care Barriers to Discharge: Continued Medical Work up   Patient Goals and CMS Choice Patient states their goals for this hospitalization and ongoing recovery are:: return home      Expected Discharge Plan and Services Expected Discharge Plan: Home/Self Care In-house Referral: Clinical Social Work     Living arrangements for the past 2 months: Single Family Home                 DME Arranged: N/A DME Agency: NA                  Prior  Living Arrangements/Services Living arrangements for the past 2 months: Single Family Home Lives with:: Spouse Patient language and need for interpreter reviewed:: Yes Do you feel safe going back to the place where you live?: Yes      Need for Family Participation in Patient Care: No (Comment) Care giver support system in place?: Yes (comment)   Criminal Activity/Legal Involvement Pertinent to Current Situation/Hospitalization: No - Comment as needed  Activities of Daily Living Home Assistive Devices/Equipment: Eyeglasses ADL Screening (condition at time of admission) Patient's cognitive ability adequate to safely complete daily activities?: Yes Is the patient deaf or have difficulty hearing?: No Does the patient have difficulty seeing, even when wearing glasses/contacts?: Yes (vision worse but not had new glasses in 5 years) Does the patient have difficulty concentrating, remembering, or making decisions?: No Patient able to express need for assistance with ADLs?: Yes Does the patient have difficulty dressing or bathing?: No Independently performs ADLs?: Yes (appropriate for developmental age) Does the patient have difficulty walking or climbing stairs?: No Weakness of Legs: Both Weakness of Arms/Hands: Both  Permission Sought/Granted Permission sought to share information with : Family Supports Permission granted to share information with : Yes, Verbal Permission Granted  Share Information with NAME: Jill Olsen  Permission granted to share info w Relationship: spouse  Permission granted to share info w Contact Information: [phone]  Emotional Assessment Appearance:: Appears stated age Attitude/Demeanor/Rapport: Gracious,Engaged Affect (typically observed): Accepting,Appropriate Orientation: : Oriented to Self,Oriented to Place,Oriented to Situation,Oriented to  Time Alcohol / Substance Use: Alcohol Use Psych Involvement: No (comment)  Admission diagnosis:   Symptomatic anemia [D64.9] Patient Active Problem List   Diagnosis Date Noted  . Symptomatic anemia 08/06/2020  . Secondary hypothyroidism   . Depression    PCP:  Reynold Bowen, MD Pharmacy:   [facility], [place] [address] Phone: [phone] Fax: [phone]     Social Determinants of Health (SDOH) Interventions    Readmission Risk Interventions Readmission Risk Prevention Plan 08/07/2020  Post Dischage Appt Complete  Medication Screening Complete  Transportation Screening Complete  Some recent data might be hidden

## 2020-08-07 NOTE — Progress Notes (Signed)
PROGRESS NOTE    Jill Olsen  WIO:[id number] DOB: Aug 15, 1965 DOA: 08/06/2020 PCP: Reynold Bowen, MD   Brief Narrative: 55 y.o. female with medical history significant for depression, hyperlipidemia, and hypothyroidism, now presenting to emergency department with fatigue tenderness patient reports that she has been passing red blood and clots per rectum adequately.  More consistently in the past 2 months.  She denies any abdominal pain, nausea, or vomiting.  She was evaluated by her PCP for this, found to have hemoglobin of 7.1, and directed to the emergency department for further evaluation.  She had a colonoscopy in 2013 with external hemorrhoids and a single polyp without evidence for malignancy.  She also had upper endoscopy in 2013 with moderate gastritis negative for dysplasia, malignancy, or H. pylori.  ED Course: Upon arrival to the ED, patient is found to be afebrile, saturating well on room air, and with stable blood pressure.  Chemistry panel was unremarkable and CBC notable for hemoglobin of 6.2.  Fecal occult blood testing was negative.  Gastroenterology was consulted by the ED physician and 2 units RBC were ordered for transfusion  Assessment & Plan:   Principal Problem:   Symptomatic anemia Active Problems:   Secondary hypothyroidism   Depression   #1 Symptomatic anemia-patient admitted with complaints of bright red bleeding per rectum.  She reports that she has a history of internal and external hemorrhoids.  She has been bleeding for weeks.  She still continues to have her menstrual bleeding which is not excessive.  In the ED on admission her hemoglobin was 6.2 she was transfused 2 units with improvement in hemoglobin up to 8.9. Anemia panel pending Seen by GI planning for EGD and colonoscopy tomorrow  #2 history of alcohol abuse-ciwa protocol  #3 history of hypothyroidism status post radioactive iodine.  Continue Synthroid  #4 hypokalemia replete check magnesium  levels.  #5 hyperlipidemia-on Repatha and Crestor.  Continue Crestor  #6 history of depression on Lexapro and Cymbalta.   Estimated body mass index is 30.45 kg/m as calculated from the following:   Height as of 02/12/13: 5' (1.524 m).   Weight as of 02/12/13: 70.7 kg.  DVT prophylaxis:scd Code Status:full Family Communication:none at bed side  Disposition Plan:  Status is: Observation   Dispo: The patient is from: Home              Anticipated d/c is to: Home              Patient currently is not medically stable to d/c.   Difficult to place patient No    Consultants:gi  Procedures: none Antimicrobials:none Subjective:  Had 2 units of prbc.  Denies any further bleeding since coming up to the floor  Objective: Vitals:   08/07/20 0234 08/07/20 0435 08/07/20 0627 08/07/20 0940  BP: (!) 142/59 (!) 148/67 (!) 174/77 (!) 159/77  Pulse: 85 79 80 75  Resp: _0 Temp: 98.5 F (36.9 C) 98 F (36.7 C) 98.2 F (36.8 C) 98.3 F (36.8 C)  TempSrc: Oral Oral Oral Oral  SpO2: 97% 98% 98% 97%    Intake/Output Summary (Last 24 hours) at 08/07/2020 1145 Last data filed at 08/07/2020 1000 Gross per 24 hour  Intake 1237.59 ml  Output 700 ml  Net 537.59 ml   There were no vitals filed for this visit.  Examination:  General exam: Appears calm and comfortable  Respiratory system: Clear to auscultation. Respiratory effort normal. Cardiovascular system: S1 & S2 heard, RRR.  No JVD, murmurs, rubs, gallops or clicks. No pedal edema. Gastrointestinal system: Abdomen is nondistended, soft and nontender. No organomegaly or masses felt. Normal bowel sounds heard. Central nervous system: Alert and oriented. No focal neurological deficits. Extremities: Symmetric 5 x 5 power. Skin: No rashes, lesions or ulcers Psychiatry: Judgement and insight appear normal. Mood & affect appropriate.     Data Reviewed: I have personally reviewed following labs and imaging  studies  CBC: Recent Labs  Lab 08/06/20 1844 08/07/20 0726  WBC 5.7  --   NEUTROABS 3.4  --   HGB 6.2* 8.9*  HCT 22.7* 29.4*  MCV 81.1  --   PLT 326  --    Basic Metabolic Panel: Recent Labs  Lab 08/06/20 1844 08/07/20 0726  NA 139 139  K 4.0 3.4*  CL 104 104  CO2 24 25  GLUCOSE 83 95  BUN 8 7  CREATININE 0.56 0.49  CALCIUM 8.9 8.8*   GFR: CrCl cannot be calculated (Unknown ideal weight.). Liver Function Tests: Recent Labs  Lab 08/06/20 1844  AST 50*  ALT 31  ALKPHOS 73  BILITOT 0.5  PROT 7.5  ALBUMIN 3.7   No results for input(s): LIPASE, AMYLASE in the last 168 hours. No results for input(s): AMMONIA in the last 168 hours. Coagulation Profile: Recent Labs  Lab 08/06/20 1844  INR 1.0   Cardiac Enzymes: No results for input(s): CKTOTAL, CKMB, CKMBINDEX, TROPONINI in the last 168 hours. BNP (last 3 results) No results for input(s): PROBNP in the last 8760 hours. HbA1C: No results for input(s): HGBA1C in the last 72 hours. CBG: No results for input(s): GLUCAP in the last 168 hours. Lipid Profile: No results for input(s): CHOL, HDL, LDLCALC, TRIG, CHOLHDL, LDLDIRECT in the last 72 hours. Thyroid Function Tests: No results for input(s): TSH, T4TOTAL, FREET4, T3FREE, THYROIDAB in the last 72 hours. Anemia Panel: Recent Labs    08/07/20 0726  RETICCTPCT 1.7   Sepsis Labs: No results for input(s): PROCALCITON, LATICACIDVEN in the last 168 hours.  No results found for this or any previous visit (from the past 240 hour(s)).       Radiology Studies: No results found.      Scheduled Meds: . DULoxetine  60 mg Oral Daily  . escitalopram  10 mg Oral Daily  . levothyroxine  125 mcg Oral Q0600  . peg 3350 powder  0.5 kit Oral Once   And  . [START ON 08/08/2020] peg 3350 powder  0.5 kit Oral Once  . rosuvastatin  20 mg Oral Daily   Continuous Infusions: . sodium chloride 100 mL/hr at 08/07/20 1036     LOS: 0 days    Georgette Shell,  MD 08/07/2020, 11:45 AM

## 2020-08-08 ENCOUNTER — Encounter (HOSPITAL_COMMUNITY): Payer: Self-pay | Admitting: Family Medicine

## 2020-08-08 ENCOUNTER — Inpatient Hospital Stay (HOSPITAL_COMMUNITY): Payer: 59

## 2020-08-08 ENCOUNTER — Inpatient Hospital Stay (HOSPITAL_COMMUNITY): Payer: 59 | Admitting: Anesthesiology

## 2020-08-08 ENCOUNTER — Encounter (HOSPITAL_COMMUNITY)
Admission: EM | Disposition: A | Discharge status: Home / Self Care | Payer: Self-pay | Source: Home / Self Care | Attending: Internal Medicine

## 2020-08-08 DIAGNOSIS — K922 Gastrointestinal hemorrhage, unspecified: Secondary | ICD-10-CM

## 2020-08-08 DIAGNOSIS — K3189 Other diseases of stomach and duodenum: Secondary | ICD-10-CM

## 2020-08-08 DIAGNOSIS — D649 Anemia, unspecified: Secondary | ICD-10-CM | POA: Diagnosis not present

## 2020-08-08 HISTORY — PX: BIOPSY: SHX5522

## 2020-08-08 HISTORY — PX: COLONOSCOPY WITH PROPOFOL: SHX5780

## 2020-08-08 HISTORY — PX: ESOPHAGOGASTRODUODENOSCOPY (EGD) WITH PROPOFOL: SHX5813

## 2020-08-08 LAB — TYPE AND SCREEN
ABO/RH(D): A POS
Antibody Screen: NEGATIVE
Unit division: 0
Unit division: 0

## 2020-08-08 LAB — LIPID PANEL
Cholesterol: 164 mg/dL (ref 0–200)
HDL: 83 mg/dL (ref >40)
LDL Cholesterol: 78 mg/dL (ref 0–99)
Total CHOL/HDL Ratio: 2 RATIO
Triglycerides: 13 mg/dL (ref <150)
VLDL: 3 mg/dL (ref 0–40)

## 2020-08-08 LAB — COMPREHENSIVE METABOLIC PANEL
ALT: 28 U/L (ref 0–44)
AST: 36 U/L (ref 15–41)
Albumin: 3.4 g/dL — ABNORMAL LOW (ref 3.5–5.0)
Alkaline Phosphatase: 69 U/L (ref 38–126)
Anion gap: 8 (ref 5–15)
BUN: <5 mg/dL — ABNORMAL LOW (ref 6–20)
CO2: 23 mmol/L (ref 22–32)
Calcium: 8.4 mg/dL — ABNORMAL LOW (ref 8.9–10.3)
Chloride: 106 mmol/L (ref 98–111)
Creatinine, Ser: 0.37 mg/dL — ABNORMAL LOW (ref 0.44–1.00)
GFR, Estimated: >60 mL/min (ref >60)
Glucose, Bld: 107 mg/dL — ABNORMAL HIGH (ref 70–99)
Potassium: 3.9 mmol/L (ref 3.5–5.1)
Sodium: 137 mmol/L (ref 135–145)
Total Bilirubin: 0.5 mg/dL (ref 0.3–1.2)
Total Protein: 7.4 g/dL (ref 6.5–8.1)

## 2020-08-08 LAB — BPAM RBC
Blood Product Expiration Date: 202206052359
Blood Product Expiration Date: 202206062359
ISSUE DATE / TIME: 202205182346
ISSUE DATE / TIME: 202205190212
Unit Type and Rh: 6200
Unit Type and Rh: 6200

## 2020-08-08 LAB — CBC
HCT: 30.8 % — ABNORMAL LOW (ref 36.0–46.0)
Hemoglobin: 8.9 g/dL — ABNORMAL LOW (ref 12.0–15.0)
MCH: 23.8 pg — ABNORMAL LOW (ref 26.0–34.0)
MCHC: 28.9 g/dL — ABNORMAL LOW (ref 30.0–36.0)
MCV: 82.4 fL (ref 80.0–100.0)
Platelets: 298 10*3/uL (ref 150–400)
RBC: 3.74 MIL/uL — ABNORMAL LOW (ref 3.87–5.11)
RDW: 17.4 % — ABNORMAL HIGH (ref 11.5–15.5)
WBC: 5.2 10*3/uL (ref 4.0–10.5)
nRBC: 0 % (ref 0.0–0.2)

## 2020-08-08 SURGERY — ESOPHAGOGASTRODUODENOSCOPY (EGD) WITH PROPOFOL
Anesthesia: Monitor Anesthesia Care

## 2020-08-08 MED ORDER — PROPOFOL 500 MG/50ML IV EMUL
INTRAVENOUS | Status: DC | PRN
  Administered 2020-08-08: 150 ug/kg/min via INTRAVENOUS

## 2020-08-08 MED ORDER — PROPOFOL 10 MG/ML IV BOLUS
INTRAVENOUS | Status: DC | PRN
  Administered 2020-08-08 (×7): 20 mg via INTRAVENOUS

## 2020-08-08 MED ORDER — LACTATED RINGERS IV SOLN
INTRAVENOUS | Status: AC | PRN
  Administered 2020-08-08: 10 mL/h via INTRAVENOUS

## 2020-08-08 MED ORDER — SODIUM CHLORIDE 0.9 % IV SOLN
510.0000 mg | Freq: Once | INTRAVENOUS | Status: AC
  Administered 2020-08-08: 510 mg via INTRAVENOUS
  Filled 2020-08-08: qty 510

## 2020-08-08 MED ORDER — PROPOFOL 500 MG/50ML IV EMUL
INTRAVENOUS | Status: AC
  Filled 2020-08-08: qty 50

## 2020-08-08 MED ORDER — PANTOPRAZOLE SODIUM 40 MG PO TBEC: 40.0000 mg | DELAYED_RELEASE_TABLET | Freq: Every day | ORAL | 1 refills | Status: DC

## 2020-08-08 MED ORDER — METOPROLOL TARTRATE 5 MG/5ML IV SOLN
5.0000 mg | Freq: Once | INTRAVENOUS | Status: AC
  Administered 2020-08-08: 5 mg via INTRAVENOUS
  Filled 2020-08-08: qty 5

## 2020-08-08 MED ORDER — FOLIC ACID 1 MG PO TABS: 1.0000 mg | ORAL_TABLET | Freq: Every day | ORAL | Status: DC

## 2020-08-08 MED ORDER — LIDOCAINE 2% (20 MG/ML) 5 ML SYRINGE
INTRAMUSCULAR | Status: DC | PRN
  Administered 2020-08-08: 100 mg via INTRAVENOUS

## 2020-08-08 SURGICAL SUPPLY — 25 items

## 2020-08-08 NOTE — Progress Notes (Deleted)
PROGRESS NOTE    Jill Olsen  JOA:[id number] DOB: 11-24-1965 DOA: 08/06/2020 PCP: Reynold Bowen, MD   Brief Narrative: 55 y.o. female with medical history significant for depression, hyperlipidemia, and hypothyroidism, now presenting to emergency department with fatigue tenderness patient reports that she has been passing red blood and clots per rectum adequately.  More consistently in the past 2 months.  She denies any abdominal pain, nausea, or vomiting.  She was evaluated by her PCP for this, found to have hemoglobin of 7.1, and directed to the emergency department for further evaluation.  She had a colonoscopy in 2013 with external hemorrhoids and a single polyp without evidence for malignancy.  She also had upper endoscopy in 2013 with moderate gastritis negative for dysplasia, malignancy, or H. pylori.  ED Course: Upon arrival to the ED, patient is found to be afebrile, saturating well on room air, and with stable blood pressure.  Chemistry panel was unremarkable and CBC notable for hemoglobin of 6.2.  Fecal occult blood testing was negative.  Gastroenterology was consulted by the ED physician and 2 units RBC were ordered for transfusion  Assessment & Plan:   Principal Problem:   Symptomatic anemia Active Problems:   Secondary hypothyroidism   Depression   Iron deficiency anemia   Rectal bleeding   #1 Symptomatic anemia-patient admitted with complaints of bright red bleeding per rectum.  She reports that she has a history of internal and external hemorrhoids.  She has been bleeding for weeks.  She still continues to have her menstrual bleeding which is not excessive.  In the ED on admission her hemoglobin was 6.2 she was transfused 2 units with improvement in hemoglobin up to 8.9. Anemia with very low iron and ferritin.  Will give a dose of Feraheme.  Seen by GI planning for EGD and colonoscopy today.  #2 history of alcohol abuse-ciwa protocol patient reported that she is a heavy  alcohol drinker she was not able to tell me how much she drinks but she reported she drinks every day  #3 history of hypothyroidism status post radioactive iodine.  Continue Synthroid  #4 hypokalemia repleted  #5 hyperlipidemia-on Repatha and Crestor.  Continue Crestor  #6 history of depression on Lexapro and Cymbalta.   Estimated body mass index is 30.45 kg/m as calculated from the following:   Height as of 02/12/13: 5' (1.524 m).   Weight as of 02/12/13: 70.7 kg.  DVT prophylaxis:scd Code Status:full Family Communication:none at bed side  Disposition Plan:  Status is: Observation   Dispo: The patient is from: Home              Anticipated d/c is to: Home              Patient currently is not medically stable to d/c.   Difficult to place patient No    Consultants:gi  Procedures: none Antimicrobials:none Subjective:  Had bowel prep without any problems now has clear red color liquidy stool Objective: Vitals:   08/07/20 1325 08/07/20 2214 08/08/20 0549 08/08/20 1310  BP: (!) 156/72 (!) 176/82 (!) 160/75 (!) 169/83  Pulse: 79 84 72 86  Resp: 18 15 17 13   Temp: 98.5 F (36.9 C) 98.1 F (36.7 C) 98.5 F (36.9 C) 98.5 F (36.9 C)  TempSrc: Oral Oral Oral Oral  SpO2: 97% 98% 100% 100%    Intake/Output Summary (Last 24 hours) at 08/08/2020 1427 Last data filed at 08/08/2020 1000 Gross per 24 hour  Intake 1140 ml  Output  2200 ml  Net -1060 ml   There were no vitals filed for this visit.  Examination:  General exam: Appears calm and comfortable  Respiratory system: Clear to auscultation. Respiratory effort normal. Cardiovascular system: S1 & S2 heard, RRR. No JVD, murmurs, rubs, gallops or clicks. No pedal edema. Gastrointestinal system: Abdomen is nondistended, soft and nontender. No organomegaly or masses felt. Normal bowel sounds heard. Central nervous system: Alert and oriented. No focal neurological deficits. Extremities: Symmetric 5 x 5 power. Skin: No  rashes, lesions or ulcers Psychiatry: Judgement and insight appear normal. Mood & affect appropriate.     Data Reviewed: I have personally reviewed following labs and imaging studies  CBC: Recent Labs  Lab 08/06/20 1844 08/07/20 0726 08/07/20 1214 08/07/20 1845 08/08/20 0040  WBC 5.7  --  4.7  --  5.2  NEUTROABS 3.4  --   --   --   --   HGB 6.2* 8.9* 9.3* 9.8* 8.9*  HCT 22.7* 29.4* 30.4* 32.8* 30.8*  MCV 81.1  --  81.7  --  82.4  PLT 326  --  303  --  314   Basic Metabolic Panel: Recent Labs  Lab 08/06/20 1844 08/07/20 0726 08/07/20 1214 08/08/20 0040  NA 139 139  --  137  K 4.0 3.4*  --  3.9  CL 104 104  --  106  CO2 24 25  --  23  GLUCOSE 83 95  --  107*  BUN 8 7  --  <5*  CREATININE 0.56 0.49  --  0.37*  CALCIUM 8.9 8.8*  --  8.4*  MG  --   --  2.0  --   PHOS  --   --  3.6  --    GFR: CrCl cannot be calculated (Unknown ideal weight.). Liver Function Tests: Recent Labs  Lab 08/06/20 1844 08/08/20 0040  AST 50* 36  ALT 31 28  ALKPHOS 73 69  BILITOT 0.5 0.5  PROT 7.5 7.4  ALBUMIN 3.7 3.4*   No results for input(s): LIPASE, AMYLASE in the last 168 hours. No results for input(s): AMMONIA in the last 168 hours. Coagulation Profile: Recent Labs  Lab 08/06/20 1844  INR 1.0   Cardiac Enzymes: No results for input(s): CKTOTAL, CKMB, CKMBINDEX, TROPONINI in the last 168 hours. BNP (last 3 results) No results for input(s): PROBNP in the last 8760 hours. HbA1C: No results for input(s): HGBA1C in the last 72 hours. CBG: No results for input(s): GLUCAP in the last 168 hours. Lipid Profile: Recent Labs    08/08/20 0040  CHOL 164  HDL 83  LDLCALC 78  TRIG 13  CHOLHDL 2.0   Thyroid Function Tests: No results for input(s): TSH, T4TOTAL, FREET4, T3FREE, THYROIDAB in the last 72 hours. Anemia Panel: Recent Labs    08/07/20 0726  VITAMINB12 253  FOLATE 12.0  FERRITIN 5*  TIBC 432  IRON 61  RETICCTPCT 1.7   Sepsis Labs: No results for  input(s): PROCALCITON, LATICACIDVEN in the last 168 hours.  No results found for this or any previous visit (from the past 240 hour(s)).       Radiology Studies: No results found.      Scheduled Meds: . [MAR Hold] DULoxetine  60 mg Oral Daily  . [MAR Hold] escitalopram  10 mg Oral Daily  . [MAR Hold] folic acid  1 mg Oral Daily  . [MAR Hold] levothyroxine  125 mcg Oral Q0600  . [MAR Hold] LORazepam  0-4 mg Intravenous Q6H  Followed by  . [MAR Hold] LORazepam  0-4 mg Intravenous Q12H  . [MAR Hold] multivitamin with minerals  1 tablet Oral Daily  . [MAR Hold] rosuvastatin  20 mg Oral Daily  . [MAR Hold] thiamine  100 mg Oral Daily   Or  . [MAR Hold] thiamine  100 mg Intravenous Daily   Continuous Infusions: . sodium chloride 100 mL/hr at 08/07/20 1036  . lactated ringers 10 mL/hr (08/08/20 1330)     LOS: 1 day    Jill Shell, MD 08/08/2020, 2:27 PM

## 2020-08-08 NOTE — Progress Notes (Signed)
Patient ID: Jill Olsen, female   DOB: [birth year], 55 y.o.   MRN: [id number]    Progress Note   Subjective  Day # 2 CC; rectal bleeding, anemia, Fe deficiency  HGB 9.8 post transfusions  Ferritin 5 Iron  61/tibc 432/sat 14 B12 /Folate WNL LFT's WNL  Tolerated prep without difficulty, says she is just passing thin red blood this morning   Objective   Vital signs in last 24 hours: Temp:  [98.1 F (36.7 C)-98.5 F (36.9 C)] 98.5 F (36.9 C) (05/20 0549) Pulse Rate:  [72-84] 72 (05/20 0549) Resp:  [15-18] 17 (05/20 0549) BP: (156-176)/(72-82) 160/75 (05/20 0549) SpO2:  [97 %-100 %] 100 % (05/20 0549) Last BM Date: 08/06/20 General:    white female in NAD Heart:  Regular rate and rhythm; no murmurs Lungs: Respirations even and unlabored, lungs CTA bilaterally Abdomen:  Soft, nontender and nondistended. Normal bowel sounds. Extremities:  Without edema. Neurologic:  Alert and oriented,  grossly normal neurologically. Psych:  Cooperative. Normal mood and affect.  Intake/Output from previous day: 05/19 0701 - 05/20 0700 In: 1220 [P.O.:480; I.V.:740] Out: 3700 [Urine:3700] Intake/Output this shift: No intake/output data recorded.  Lab Results: Recent Labs    08/06/20 1844 08/07/20 0726 08/07/20 1214 08/07/20 1845 08/08/20 0040  WBC 5.7  --  4.7  --  5.2  HGB 6.2*   < > 9.3* 9.8* 8.9*  HCT 22.7*   < > 30.4* 32.8* 30.8*  PLT 326  --  303  --  298   < > = values in this interval not displayed.   BMET Recent Labs    08/06/20 1844 08/07/20 0726 08/08/20 0040  NA 139 139 137  K 4.0 3.4* 3.9  CL 104 104 106  CO2 24 25 23   GLUCOSE 83 95 107*  BUN 8 7 <5*  CREATININE 0.56 0.49 0.37*  CALCIUM 8.9 8.8* 8.4*   LFT Recent Labs    08/08/20 0040  PROT 7.4  ALBUMIN 3.4*  AST 36  ALT 28  ALKPHOS 69  BILITOT 0.5   PT/INR Recent Labs    08/06/20 1844  LABPROT 13.4  INR 1.0        Assessment / Plan:    #68 55 year old white female with 85-month  history of daily bright red blood with bowel movements, admitted with profound anemia hemoglobin of 6.2 and also found to be iron deficient.  Prior colonoscopy 2013 with finding of external hemorrhoids and a small hyperplastic polyp was removed.  Etiology of bleeding is not clear, rule out neoplasm, proctitis/colitis, internal hemorrhoids With iron deficiency suspect chronicity to problem and therefore also need to consider chronic gastropathy etc. Contributing.  #2 heavy EtOH use-chronic no evidence of cirrhosis by labs Will schedule for upper abdominal ultrasound for tomorrow  #3 depression #4 hypothyroidism status post radioactive iodine  Plan; patient is scheduled for EGD and colonoscopy today Further recommendations pending findings. We will need to start oral iron supplementation at discharge Order upper abdominal ultrasound    Principal Problem:   Symptomatic anemia Active Problems:   Secondary hypothyroidism   Depression   Iron deficiency anemia   Rectal bleeding     LOS: 1 day   Kailan Carmen EsterwoodPA-C  08/08/2020, 8:38 AM

## 2020-08-08 NOTE — Interval H&P Note (Signed)
History and Physical Interval Note:  08/08/2020 1:56 PM  Jill Olsen  has presented today for surgery, with the diagnosis of Anemia, rectal bleeding.  The various methods of treatment have been discussed with the patient and family. After consideration of risks, benefits and other options for treatment, the patient has consented to  Procedure(s): ESOPHAGOGASTRODUODENOSCOPY (EGD) WITH PROPOFOL (N/A) COLONOSCOPY WITH PROPOFOL (N/A) as a surgical intervention.  The patient's history has been reviewed, patient examined, no change in status, stable for surgery.  I have reviewed the patient's chart and labs.  Questions were answered to the patient's satisfaction.     Thornton Park

## 2020-08-08 NOTE — Progress Notes (Signed)
Patient received discharge orders, education given regarding new medications and instructions.IV removed prior to DC. All questions answered, patient walked down with staff member and husband. Jill Olsen

## 2020-08-08 NOTE — Anesthesia Preprocedure Evaluation (Addendum)
Anesthesia Evaluation  Patient identified by MRN, date of birth, ID band Patient awake    Reviewed: Allergy & Precautions, NPO status , Patient's Chart, lab work & pertinent test results  Airway Mallampati: I       Dental no notable dental hx.    Pulmonary former smoker,    Pulmonary exam normal        Cardiovascular Normal cardiovascular exam     Neuro/Psych PSYCHIATRIC DISORDERS Depression negative neurological ROS     GI/Hepatic (+)     substance abuse  alcohol use,   Endo/Other  Hypothyroidism   Renal/GU      Musculoskeletal   Abdominal Normal abdominal exam  (+)   Peds  Hematology  (+) anemia ,   Anesthesia Other Findings   Reproductive/Obstetrics                             Anesthesia Physical Anesthesia Plan  ASA: II  Anesthesia Plan: MAC   Post-op Pain Management:    Induction:   PONV Risk Score and Plan: 2 and Treatment may vary due to age or medical condition  Airway Management Planned: Natural Airway and Mask  Additional Equipment: None  Intra-op Plan:   Post-operative Plan:   Informed Consent: I have reviewed the patients History and Physical, chart, labs and discussed the procedure including the risks, benefits and alternatives for the proposed anesthesia with the patient or authorized representative who has indicated his/her understanding and acceptance.     Dental advisory given  Plan Discussed with: CRNA  Anesthesia Plan Comments:         Anesthesia Quick Evaluation

## 2020-08-08 NOTE — Op Note (Signed)
[facility] Patient Name: Jill Olsen Procedure Date: 08/08/2020 MRN: [id number] Attending MD: Thornton Park MD, MD Date of Birth: 1965-12-27 CSN: [id number] Age: 55 Admit Type: Inpatient Procedure:                Upper GI endoscopy Indications:              Gastrointestinal bleeding of unknown origin Providers:                Thornton Park MD, MD, Nelia Shi, RN,                            Tyrone Apple, Technician, Danley Danker, CRNA Referring MD:              Medicines:                Monitored Anesthesia Care Complications:            No immediate complications. Estimated blood loss:                            Minimal. Estimated Blood Loss:     Estimated blood loss was minimal. Procedure:                Pre-Anesthesia Assessment:                           - Prior to the procedure, a History and Physical                            was performed, and patient medications and                            allergies were reviewed. The patient's tolerance of                            previous anesthesia was also reviewed. The risks                            and benefits of the procedure and the sedation                            options and risks were discussed with the patient.                            All questions were answered, and informed consent                            was obtained. Prior Anticoagulants: The patient has                            taken no previous anticoagulant or antiplatelet                            agents. ASA Grade Assessment: II - A patient with  mild systemic disease. After reviewing the risks                            and benefits, the patient was deemed in                            satisfactory condition to undergo the procedure.                           After obtaining informed consent, the endoscope was                            passed under direct vision. Throughout the                             procedure, the patient's blood pressure, pulse, and                            oxygen saturations were monitored continuously. The                            [id number]-H190 ([id number] Olympus gastroscope was                            introduced through the mouth, and advanced to the                            third part of duodenum. The upper GI endoscopy was                            accomplished without difficulty. The patient                            tolerated the procedure well. Scope In: Scope Out: Findings:      The esophagus was normal.      A single medium erosion with no bleeding and no stigmata of recent       bleeding was found in the prepyloric region of the stomach. Biopsies       were taken from the antrum, body, and fundus with a cold forceps for       histology. Estimated blood loss was minimal.      The examined duodenum was normal. Biopsies were taken with a cold       forceps for histology to exclude celiac. Estimated blood loss was       minimal.      The exam was otherwise without abnormality. No blood present. Impression:               - Erosive gastropathy with no bleeding and no                            stigmata of recent bleeding. Biopsied.                           - Source of recent bleeding not identified by this  exam. Moderate Sedation:      Not Applicable - Patient had care per Anesthesia. Recommendation:           - Return to hospital ward for ongoing care.                           - Resume previous diet.                           - Continue present medications.                           - Start pantoprazole 40 mg QAM x 8 weeks.                           - Await pathology results.                           - No aspirin, ibuprofen, naproxen, or other                            non-steroidal anti-inflammatory drugs.                           - Proceed with colonoscopy. Procedure Code(s):        --- Professional  ---                           [phone], Esophagogastroduodenoscopy, flexible,                            transoral; with biopsy, single or multiple Diagnosis Code(s):        --- Professional ---                           K31.89, Other diseases of stomach and duodenum                           K92.2, Gastrointestinal hemorrhage, unspecified CPT copyright 2019 [employer]. All rights reserved. The codes documented in this report are preliminary and upon coder review may  be revised to meet current compliance requirements. Thornton Park MD, MD 08/08/2020 2:44:14 PM This report has been signed electronically. Number of Addenda: 0

## 2020-08-08 NOTE — Discharge Summary (Signed)
Physician Discharge Summary  HERO MCCATHERN TKZ:[id number] DOB: Jun 21, 1965 DOA: 08/06/2020  PCP: Reynold Bowen, MD  Admit date: 08/06/2020 Discharge date: 08/08/2020  Admitted From: Home  disposition: Home  Recommendations for Outpatient Follow-up:  1. Follow up with PCP in 1-2 weeks/please note that she needs a general surgery consult she wanted to talk to PCP first. 2. Please obtain BMP/CBC in one week 3. Please follow up with general surgeon of your choice for bleeding external hemorrhoids  Home Health: None Equipment/Devices: None Discharge Condition: Stable CODE STATUS: Full code Diet recommendation: Cardiac diet Brief/Interim Summary:54 y.o.femalewith medical history significant fordepression, hyperlipidemia, and hypothyroidism, now presenting to emergency department with fatigue tenderness patient reports that she has been passing red blood and clots per rectum adequately. More consistently in the past 2 months. She denies any abdominal pain, nausea, or vomiting. She was evaluated by her PCP for this, found to have hemoglobin of 7.1, and directed to the emergency department for further evaluation. She had a colonoscopy in 2013 with external hemorrhoids and a single polyp without evidence for malignancy.She also had upper endoscopy in 2013 with moderate gastritis negative for dysplasia, malignancy, or H. pylori.  ED Course:Upon arrival to the ED, patient is found to be afebrile, saturating well on room air, and with stable blood pressure. Chemistry panel was unremarkable and CBC notable for hemoglobin of 6.2. Fecal occult blood testing was negative. Gastroenterology was consulted by the ED physician and 2 units RBC were ordered for transfusion   Discharge Diagnoses:  Principal Problem:   Symptomatic anemia Active Problems:   Secondary hypothyroidism   Depression   Iron deficiency anemia   Rectal bleeding  #1 Symptomatic anemia-patient admitted with complaints of  bright red bleeding per rectum.  She reports that she has a history of internal and external hemorrhoids.  She has been bleeding for weeks.  She still continues to have her menstrual bleeding which is not excessive.  In the ED on admission her hemoglobin was 6.2 she was transfused 2 units with improvement in hemoglobin up to 8.9. Anemia with very low iron and ferritin.  She received a dose of Feraheme.   She had EGD and colonoscopy done by Dr. Tarri Glenn on 08/08/2020. She had bulky external hemorrhoids, some small internal hemorrhoids and a single gastric erosion. She was recommended to follow-up with general surgery.  She wanted to speak with her PCP prior to making an appointment with a general surgeon.   #2 history of alcohol abuse-counseled and advised regarding cessation of alcohol intake.   #3 history of hypothyroidism status post radioactive iodine.  Continue Synthroid  #4 hypokalemia repleted  #5 hyperlipidemia-on Repatha and Crestor.  Continue Crestor  #6 history of depression on Lexapro and Cymbalta.   Estimated body mass index is 30.45 kg/m as calculated from the following:   Height as of 02/12/13: 5' (1.524 m).   Weight as of 02/12/13: 70.7 kg.  Discharge Instructions  Discharge Instructions    Diet - low sodium heart healthy   Complete by: As directed    Increase activity slowly   Complete by: As directed      Allergies as of 08/08/2020      Reactions   Codeine Itching   Lips numb   Dilaudid [hydromorphone Hcl] Itching   Fentanyl Itching      Medication List    STOP taking these medications   oxyCODONE-acetaminophen 5-325 MG tablet Commonly known as: PERCOCET/ROXICET     TAKE these medications   acetaminophen  500 MG tablet Commonly known as: TYLENOL Take 500 mg by mouth every 6 (six) hours as needed for moderate pain.   DULoxetine 60 MG capsule Commonly known as: CYMBALTA Take 60 mg by mouth daily.   escitalopram 10 MG tablet Commonly known as:  LEXAPRO Take 1 tablet by mouth daily.   folic acid 1 MG tablet Commonly known as: FOLVITE Take 1 tablet (1 mg total) by mouth daily. Start taking on: Aug 09, 2020   pantoprazole 40 MG tablet Commonly known as: Protonix Take 1 tablet (40 mg total) by mouth daily.   Repatha SureClick 628 MG/ML Soaj Generic drug: Evolocumab Inject 140 mg into the skin every 14 (fourteen) days.   rosuvastatin 20 MG tablet Commonly known as: CRESTOR Take 20 mg by mouth daily.   Unithroid 125 MCG tablet Generic drug: levothyroxine Take 125 mcg by mouth daily before breakfast. What changed: Another medication with the same name was removed. Continue taking this medication, and follow the directions you see here.       Follow-up Information    Reynold Bowen, MD Follow up.   Specialty: Endocrinology Contact information: [place] [address] [phone]              Allergies  Allergen Reactions  . Codeine Itching    Lips numb  . Dilaudid [Hydromorphone Hcl] Itching  . Fentanyl Itching    Consultations:  [facility]   Procedures/Studies:  No results found. (Echo, Carotid, EGD, Colonoscopy, ERCP)    Subjective:  No new complaints today Discharge Exam: Vitals:   08/08/20 1440 08/08/20 1450  BP: (!) 147/58 (!) 162/70  Pulse: 87 83  Resp: (!) 21 19  Temp:    SpO2: 100% 100%   Vitals:   08/08/20 1310 08/08/20 1437 08/08/20 1440 08/08/20 1450  BP: (!) 169/83 (!) 150/47 (!) 147/58 (!) 162/70  Pulse: 86 92 87 83  Resp: 13 20 (!) 21 19  Temp: 98.5 F (36.9 C) 97.9 F (36.6 C)    TempSrc: Oral Oral    SpO2: 100%  100% 100%    General: Pt is alert, awake, not in acute distress Cardiovascular: RRR, S1/S2 +, no rubs, no gallops Respiratory: CTA bilaterally, no wheezing, no rhonchi Abdominal: Soft, NT, ND, bowel sounds + Extremities: no edema, no cyanosis    The results of significant diagnostics from this hospitalization (including imaging,  microbiology, ancillary and laboratory) are listed below for reference.     Microbiology: No results found for this or any previous visit (from the past 240 hour(s)).   Labs: BNP (last 3 results) No results for input(s): BNP in the last 8760 hours. Basic Metabolic Panel: Recent Labs  Lab 08/06/20 1844 08/07/20 0726 08/07/20 1214 08/08/20 0040  NA 139 139  --  137  K 4.0 3.4*  --  3.9  CL 104 104  --  106  CO2 24 25  --  23  GLUCOSE 83 95  --  107*  BUN 8 7  --  <5*  CREATININE 0.56 0.49  --  0.37*  CALCIUM 8.9 8.8*  --  8.4*  MG  --   --  2.0  --   PHOS  --   --  3.6  --    Liver Function Tests: Recent Labs  Lab 08/06/20 1844 08/08/20 0040  AST 50* 36  ALT 31 28  ALKPHOS 73 69  BILITOT 0.5 0.5  PROT 7.5 7.4  ALBUMIN 3.7 3.4*   No results  for input(s): LIPASE, AMYLASE in the last 168 hours. No results for input(s): AMMONIA in the last 168 hours. CBC: Recent Labs  Lab 08/06/20 1844 08/07/20 0726 08/07/20 1214 08/07/20 1845 08/08/20 0040  WBC 5.7  --  4.7  --  5.2  NEUTROABS 3.4  --   --   --   --   HGB 6.2* 8.9* 9.3* 9.8* 8.9*  HCT 22.7* 29.4* 30.4* 32.8* 30.8*  MCV 81.1  --  81.7  --  82.4  PLT 326  --  303  --  298   Cardiac Enzymes: No results for input(s): CKTOTAL, CKMB, CKMBINDEX, TROPONINI in the last 168 hours. BNP: Invalid input(s): POCBNP CBG: No results for input(s): GLUCAP in the last 168 hours. D-Dimer No results for input(s): DDIMER in the last 72 hours. Hgb A1c No results for input(s): HGBA1C in the last 72 hours. Lipid Profile Recent Labs    08/08/20 0040  CHOL 164  HDL 83  LDLCALC 78  TRIG 13  CHOLHDL 2.0   Thyroid function studies No results for input(s): TSH, T4TOTAL, T3FREE, THYROIDAB in the last 72 hours.  Invalid input(s): FREET3 Anemia work up Recent Labs    08/07/20 0726  VITAMINB12 253  FOLATE 12.0  FERRITIN 5*  TIBC 432  IRON 61  RETICCTPCT 1.7   Urinalysis    Component Value Date/Time   COLORURINE  YELLOW 05/05/2013 1957   APPEARANCEUR CLEAR 05/05/2013 1957   LABSPEC 1.010 05/05/2013 1957   PHURINE 5.5 05/05/2013 1957   GLUCOSEU NEGATIVE 05/05/2013 1957   HGBUR NEGATIVE 05/05/2013 1957   BILIRUBINUR NEGATIVE 05/05/2013 1957   KETONESUR NEGATIVE 05/05/2013 1957   PROTEINUR NEGATIVE 05/05/2013 1957   UROBILINOGEN 0.2 05/05/2013 1957   NITRITE NEGATIVE 05/05/2013 1957   LEUKOCYTESUR NEGATIVE 05/05/2013 1957   Sepsis Labs Invalid input(s): PROCALCITONIN,  WBC,  LACTICIDVEN Microbiology No results found for this or any previous visit (from the past 240 hour(s)).   Time coordinating discharge: 37 minutes  SIGNED:   Georgette Shell, MD  [facility] 08/08/2020, 3:04 PM

## 2020-08-08 NOTE — Anesthesia Postprocedure Evaluation (Signed)
Anesthesia Post Note  Patient: Jill Olsen  Procedure(s) Performed: ESOPHAGOGASTRODUODENOSCOPY (EGD) WITH PROPOFOL (N/A ) COLONOSCOPY WITH PROPOFOL (N/A ) BIOPSY     Patient location during evaluation: Endoscopy Anesthesia Type: MAC Level of consciousness: awake Pain management: pain level controlled Vital Signs Assessment: post-procedure vital signs reviewed and stable Respiratory status: spontaneous breathing, nonlabored ventilation, respiratory function stable and patient connected to nasal cannula oxygen Cardiovascular status: stable and blood pressure returned to baseline Postop Assessment: no apparent nausea or vomiting Anesthetic complications: no   No complications documented.  Last Vitals:  Vitals:   08/08/20 1639 08/08/20 1650  BP: (!) 191/72 (!) 192/76  Pulse: 64   Resp: 16   Temp: 36.4 C   SpO2: 100%     Last Pain:  Vitals:   08/08/20 1639  TempSrc: Axillary  PainSc:                  Karyl Kinnier Nikka Hakimian

## 2020-08-08 NOTE — Anesthesia Procedure Notes (Signed)
Date/Time: 08/08/2020 2:01 PM Performed by: Sharlette Dense, CRNA Oxygen Delivery Method: Simple face mask

## 2020-08-08 NOTE — Op Note (Signed)
[facility] Patient Name: Jill Olsen Procedure Date: 08/08/2020 MRN: [id number] Attending MD: Thornton Park MD, MD Date of Birth: [birth year] CSN: [id number] Age: 55 Admit Type: Inpatient Procedure:                Colonoscopy Indications:              Rectal bleeding Providers:                Thornton Park MD, MD, Nelia Shi, RN,                            Tyrone Apple, Technician, Danley Danker, CRNA Referring MD:              Medicines:                Monitored Anesthesia Care Complications:            No immediate complications. Estimated Blood Loss:     Estimated blood loss: none. Procedure:                Pre-Anesthesia Assessment:                           - Prior to the procedure, a History and Physical                            was performed, and patient medications and                            allergies were reviewed. The patient's tolerance of                            previous anesthesia was also reviewed. The risks                            and benefits of the procedure and the sedation                            options and risks were discussed with the patient.                            All questions were answered, and informed consent                            was obtained. Prior Anticoagulants: The patient has                            taken no previous anticoagulant or antiplatelet                            agents. ASA Grade Assessment: II - A patient with                            mild systemic disease. After reviewing the risks  and benefits, the patient was deemed in                            satisfactory condition to undergo the procedure.                           After obtaining informed consent, the colonoscope                            was passed under direct vision. Throughout the                            procedure, the patient's blood pressure, pulse, and                             oxygen saturations were monitored continuously. The                            CF-HQ190L ([id number]) Olympus colonoscope was                            introduced through the anus and advanced to the 3                            cm into the ileum. The colonoscopy was performed                            without difficulty. The patient tolerated the                            procedure well. The quality of the bowel                            preparation was good. The terminal ileum, ileocecal                            valve, appendiceal orifice, and rectum were                            photographed. Scope In: 2:19:47 PM Scope Out: 2:31:03 PM Scope Withdrawal Time: 0 hours 7 minutes 20 seconds  Total Procedure Duration: 0 hours 11 minutes 16 seconds  Findings:      Bulky external hemorrhoids were found on perianal exam. There are small       internal hemorrhoids. There were not bleeding at the time of the       procedure but clots were present in the anal canal.      A few small and large-mouthed diverticula were found in the sigmoid       colon. No evidence for diverticulitis.      The exam was otherwise without abnormality on direct and retroflexion       views including evaluation of the terminal ileum. No blood was seen       above the anal canal. Impression:               -  External hemorrhoids - thought to be the source                            of bleeding.                           - Diverticulosis in the sigmoid colon. Moderate Sedation:      Not Applicable - Patient had care per Anesthesia. Recommendation:           - Return patient to hospital ward for ongoing care.                           - High fiber diet.                           - Use daily Benefiber or Citrucel for stool bulking.                           - Sitz baths may provide some additional relief.                           - If you would like more information,                            MyGIHealth.com and  [email or web address].com have good                            information about hemorrhoids.                           - Outpatient surgical consultation re: hemorrhoids.                           - Repeat colonoscopy in 10 years for colon cancer                            screening, earlier with new symptoms. Procedure Code(s):        --- Professional ---                           [phone], Colonoscopy, flexible; diagnostic, including                            collection of specimen(s) by brushing or washing,                            when performed (separate procedure) Diagnosis Code(s):        --- Professional ---                           K64.9, Unspecified hemorrhoids                           K62.5, Hemorrhage of anus and rectum  K57.30, Diverticulosis of large intestine without                            perforation or abscess without bleeding CPT copyright 2019 American Medical Association. All rights reserved. The codes documented in this report are preliminary and upon coder review may  be revised to meet current compliance requirements. Tressia Danas MD, MD 08/08/2020 2:49:48 PM This report has been signed electronically. Number of Addenda: 0

## 2020-08-08 NOTE — Transfer of Care (Signed)
Immediate Anesthesia Transfer of Care Note  Patient: Jill Olsen  Procedure(s) Performed: ESOPHAGOGASTRODUODENOSCOPY (EGD) WITH PROPOFOL (N/A ) COLONOSCOPY WITH PROPOFOL (N/A ) BIOPSY  Patient Location: Endoscopy Unit  Anesthesia Type:MAC  Level of Consciousness: awake and alert   Airway & Oxygen Therapy: Patient Spontanous Breathing and Patient connected to face mask oxygen  Post-op Assessment: Report given to RN and Post -op Vital signs reviewed and stable  Post vital signs: Reviewed and stable  Last Vitals:  Vitals Value Taken Time  BP    Temp    Pulse 87 08/08/20 1439  Resp 21 08/08/20 1439  SpO2 100 % 08/08/20 1439  Vitals shown include unvalidated device data.  Last Pain:  Vitals:   08/08/20 1310  TempSrc: Oral  PainSc: 0-No pain      Patients Stated Pain Goal: 2 (88/75/79 7282)  Complications: No complications documented.

## 2020-08-11 ENCOUNTER — Encounter (HOSPITAL_COMMUNITY): Payer: Self-pay | Admitting: Gastroenterology

## 2020-08-11 LAB — SURGICAL PATHOLOGY

## 2020-08-14 ENCOUNTER — Encounter: Payer: Self-pay | Admitting: Gastroenterology

## 2020-09-03 ENCOUNTER — Encounter: Payer: Self-pay | Admitting: Physician Assistant

## 2020-10-06 ENCOUNTER — Ambulatory Visit: Payer: 59 | Admitting: Physician Assistant

## 2020-10-31 DIAGNOSIS — E785 Hyperlipidemia, unspecified: Secondary | ICD-10-CM | POA: Insufficient documentation

## 2020-10-31 DIAGNOSIS — E079 Disorder of thyroid, unspecified: Secondary | ICD-10-CM | POA: Insufficient documentation

## 2020-11-03 ENCOUNTER — Ambulatory Visit (INDEPENDENT_AMBULATORY_CARE_PROVIDER_SITE_OTHER): Payer: 59 | Admitting: Nurse Practitioner

## 2020-11-03 ENCOUNTER — Encounter: Payer: Self-pay | Admitting: Nurse Practitioner

## 2020-11-03 ENCOUNTER — Other Ambulatory Visit (INDEPENDENT_AMBULATORY_CARE_PROVIDER_SITE_OTHER): Payer: 59

## 2020-11-03 VITALS — BP 130/80 | HR 93 | Ht 60.0 in | Wt 151.0 lb

## 2020-11-03 DIAGNOSIS — K644 Residual hemorrhoidal skin tags: Secondary | ICD-10-CM

## 2020-11-03 DIAGNOSIS — D508 Other iron deficiency anemias: Secondary | ICD-10-CM

## 2020-11-03 LAB — CBC
HCT: 35.4 % — ABNORMAL LOW (ref 36.0–46.0)
Hemoglobin: 11.5 g/dL — ABNORMAL LOW (ref 12.0–15.0)
MCHC: 32.4 g/dL (ref 30.0–36.0)
MCV: 90 fl (ref 78.0–100.0)
Platelets: 285 10*3/uL (ref 150.0–400.0)
RBC: 3.94 Mil/uL (ref 3.87–5.11)
RDW: 16 % — ABNORMAL HIGH (ref 11.5–15.5)
WBC: 6.2 10*3/uL (ref 4.0–10.5)

## 2020-11-03 NOTE — Progress Notes (Signed)
ASSESSMENT AND PLAN    # 55 yo female hospitalized in May 2022 with severe iron deficiency anemia , probably a combination of hemorrhoid bleeding and heavy menses. On inpatient colonoscopy in May she had small internal hemorrhoids ( non-bleeding but there was blood in anal canal) and bulky external hemorrhoids which were felt to be source of bleeding.  --Required two units of blood in the hospital. Hgb in 9 range upon discharge in mid May. She has continued to have intermittent rectal bleeding though not as much as prior to admission. Still has heavy menses. Repeat CBC today -- Needs to see a GYN  --Will refer to [facility] for evaluation of persistent external hemorrhoid bleeding  HISTORY OF PRESENT ILLNESS    Chief Complaint : hospital follow up  Jill Olsen is a 55 y.o. female known remotely to Dr. Ardis Hughs  with a past medical history significant for Graves dz treated with RAI with subsequent hypothyroidism, hyperlipidemia, appendectomy, cholecystectom. See PMH below for any additional medical problems.    Patient was hospitalized in May 2022 with profound iron deficiency anemia. Transfused 2 u PRBCs and also a dose of IV iron with improvement in hgb from 6.2 to 8.9 by time of discharge.  She began having intermittent painless rectal bleeding in January 2022. Additionally she has heavy menstrual cycles. She had an inpatient EGD / colonoscopy during May admission. Bleeding felt to be secondary to large external hemorrhoids.   INTERVAL HISTORY:  Here for hospital follow up. She is still having intermittent rectal bleeding though episodes are less frequent than prior to being hospitalized. She is not taking fiber, didn't know we had asked her to but her stools are soft, even loose at times. She is still having heavy menses at times.    PREVIOUS ENDOSCOPIC EVALUATIONS / PERTINENT STUDIES:    May 2022 EGD -Erosive gastropathy with no bleeding and no stigmata  May 2022 colonoscopy  --Small  internal hemorrhoids, nonbleeding but there was blood in the anal canal . Large bulky external hemorrhoids felt to be the source of bleeding . Diverticulosis    Past Medical History:  Diagnosis Date   Alcohol abuse    Depression    Hyperlipidemia    Hyperthyroidism    s/p radioactive iodine therapy. resolved.   Secondary hypothyroidism     Current Medications, Allergies, Past Surgical History, Family History and Social History were reviewed in [employer] record.   Current Outpatient Medications  Medication Sig Dispense Refill   acetaminophen (TYLENOL) 500 MG tablet Take 500 mg by mouth every 6 (six) hours as needed for moderate pain.     DULoxetine (CYMBALTA) 60 MG capsule Take 60 mg by mouth daily.     escitalopram (LEXAPRO) 10 MG tablet Take 1 tablet by mouth daily.     folic acid (FOLVITE) 1 MG tablet Take 1 tablet (1 mg total) by mouth daily.     levothyroxine (UNITHROID) 125 MCG tablet Take 125 mcg by mouth daily before breakfast.     pantoprazole (PROTONIX) 40 MG tablet Take 1 tablet (40 mg total) by mouth daily. 30 tablet 1   REPATHA SURECLICK [id number] MG/ML SOAJ Inject 140 mg into the skin every 14 (fourteen) days.     rosuvastatin (CRESTOR) 20 MG tablet Take 20 mg by mouth daily.     No current facility-administered medications for this visit.    Review of Systems: No chest pain. No shortness of breath. No urinary complaints.  PHYSICAL EXAM :    Wt Readings from Last 3 Encounters:  02/12/13 155 lb 14.4 oz (70.7 kg)  11/12/11 155 lb (70.3 kg)  11/01/11 155 lb 9.6 oz (70.6 kg)    Constitutional:  Pleasant female in no acute distress. Psychiatric: Normal mood and affect. Behavior is normal. EENT: Pupils normal.  Conjunctivae are normal. No scleral icterus. Neck supple.  Cardiovascular: Normal rate, regular rhythm. No edema Pulmonary/chest: Effort normal and breath sounds normal. No wheezing, rales or rhonchi. Abdominal: Soft, nondistended,  nontender. Bowel sounds active throughout. There are no masses palpable. No hepatomegaly. Neurological: Alert and oriented to person place and time. Skin: Skin is warm and dry. No rashes noted.  Tye Savoy, NP  11/03/2020, 11:23 AM  Cc:   Reynold Bowen, MD

## 2020-11-03 NOTE — Progress Notes (Signed)
I agree with the above note, plan 

## 2020-11-03 NOTE — Patient Instructions (Addendum)
If you are age 54 or younger, your body mass index should be between 19-25. Your Body mass index is 29.49 kg/m. If this is out of the aformentioned range listed, please consider follow up with your Primary Care Provider.   LABS:  Lab work has been ordered for you today. Our lab is located in the basement. Press "B" on the elevator. The lab is located at the first door on the left as you exit the elevator.  We place placed a referral to [facility].  You should hear from their office in the next couple weeks to schedule an appointment. If you do not hear from them please call them at [phone].  It was great seeing you today! Thank you for entrusting me with your care and choosing [facility].  Tye Savoy, NP

## 2021-02-16 ENCOUNTER — Other Ambulatory Visit: Payer: Self-pay

## 2021-02-16 ENCOUNTER — Encounter (HOSPITAL_COMMUNITY): Payer: Self-pay

## 2021-02-16 ENCOUNTER — Emergency Department (HOSPITAL_COMMUNITY)
Admission: EM | Admit: 2021-02-16 | Discharge: 2021-02-16 | Disposition: A | Discharge status: Home / Self Care | Payer: 59 | Attending: Emergency Medicine | Admitting: Emergency Medicine

## 2021-02-16 ENCOUNTER — Emergency Department (HOSPITAL_COMMUNITY): Payer: 59

## 2021-02-16 DIAGNOSIS — D649 Anemia, unspecified: Secondary | ICD-10-CM

## 2021-02-16 DIAGNOSIS — N9489 Other specified conditions associated with female genital organs and menstrual cycle: Secondary | ICD-10-CM | POA: Insufficient documentation

## 2021-02-16 DIAGNOSIS — R0789 Other chest pain: Secondary | ICD-10-CM | POA: Diagnosis not present

## 2021-02-16 DIAGNOSIS — R799 Abnormal finding of blood chemistry, unspecified: Secondary | ICD-10-CM | POA: Insufficient documentation

## 2021-02-16 DIAGNOSIS — R519 Headache, unspecified: Secondary | ICD-10-CM | POA: Insufficient documentation

## 2021-02-16 DIAGNOSIS — R0602 Shortness of breath: Secondary | ICD-10-CM | POA: Insufficient documentation

## 2021-02-16 DIAGNOSIS — Z87891 Personal history of nicotine dependence: Secondary | ICD-10-CM | POA: Diagnosis not present

## 2021-02-16 DIAGNOSIS — E039 Hypothyroidism, unspecified: Secondary | ICD-10-CM | POA: Diagnosis not present

## 2021-02-16 DIAGNOSIS — Z79899 Other long term (current) drug therapy: Secondary | ICD-10-CM | POA: Insufficient documentation

## 2021-02-16 DIAGNOSIS — K625 Hemorrhage of anus and rectum: Secondary | ICD-10-CM

## 2021-02-16 LAB — BASIC METABOLIC PANEL
Anion gap: 10 (ref 5–15)
BUN: 6 mg/dL (ref 6–20)
CO2: 26 mmol/L (ref 22–32)
Calcium: 8.7 mg/dL — ABNORMAL LOW (ref 8.9–10.3)
Chloride: 100 mmol/L (ref 98–111)
Creatinine, Ser: 0.45 mg/dL (ref 0.44–1.00)
GFR, Estimated: >60 mL/min (ref >60)
Glucose, Bld: 88 mg/dL (ref 70–99)
Potassium: 3.6 mmol/L (ref 3.5–5.1)
Sodium: 136 mmol/L (ref 135–145)

## 2021-02-16 LAB — CBC
HCT: 28.3 % — ABNORMAL LOW (ref 36.0–46.0)
Hemoglobin: 8 g/dL — ABNORMAL LOW (ref 12.0–15.0)
MCH: 21.8 pg — ABNORMAL LOW (ref 26.0–34.0)
MCHC: 28.3 g/dL — ABNORMAL LOW (ref 30.0–36.0)
MCV: 77.1 fL — ABNORMAL LOW (ref 80.0–100.0)
Platelets: 319 10*3/uL (ref 150–400)
RBC: 3.67 MIL/uL — ABNORMAL LOW (ref 3.87–5.11)
RDW: 17.7 % — ABNORMAL HIGH (ref 11.5–15.5)
WBC: 6.1 10*3/uL (ref 4.0–10.5)
nRBC: 0 % (ref 0.0–0.2)

## 2021-02-16 LAB — I-STAT BETA HCG BLOOD, ED (MC, WL, AP ONLY): I-stat hCG, quantitative: <5 m[IU]/mL (ref <5)

## 2021-02-16 LAB — TROPONIN I (HIGH SENSITIVITY)
Troponin I (High Sensitivity): 3 ng/L (ref <18)
Troponin I (High Sensitivity): 3 ng/L (ref <18)

## 2021-02-16 NOTE — ED Notes (Signed)
Pt NAD, a/ox4. Pt verbalizes understanding of all DC and f/u instructions. All questions answered. Pt walks with steady gait to lobby at DC with significant other.

## 2021-02-16 NOTE — ED Notes (Signed)
Pt transported to xray 

## 2021-02-16 NOTE — ED Triage Notes (Signed)
Patient reports Hgb 8.1. Patient states she has rectal bleeding frequently.  Patient also reports that she had chest pain that radiated into her back yesterday but not today.  Patient also reports nausea.

## 2021-02-16 NOTE — ED Provider Notes (Signed)
[facility] DEPT Provider Note   CSN: [id number] Arrival date & time: 02/16/21  1751     History Chief Complaint  Patient presents with   Abnormal Lab   Chest Pain    Jill Olsen is a 55 y.o. female.  Patient presents to the ED with a chief complaint of abnormal lab.  She states that her HGB is 8.1.  Had labs drawn today by outside facility.  Required blood transfusion back in May of 2022.  Has been having persistent rectal bleeding for the past year.  Had a colonoscopy and endoscopy in May.  States that rectal bleeding is thought to have been from hemorrhoids.  Also reports having very heavy periods.  States that she is going to talk to her doctor about a hysterectomy.  Had a brief episode of chest pain yesterday and SOB.  States that today she feels tired and has had a slight headache.  The history is provided by the patient. No language interpreter was used.      Past Medical History:  Diagnosis Date   Alcohol abuse    Anemia    Anxiety    Depression    Hyperlipidemia    Hyperthyroidism    s/p radioactive iodine therapy. resolved.   Secondary hypothyroidism     Patient Active Problem List   Diagnosis Date Noted   Hyperlipidemia 10/31/2020   Disease of thyroid gland 10/31/2020   Iron deficiency anemia    Rectal bleeding    Symptomatic anemia 08/06/2020   Secondary hypothyroidism    Depression     Past Surgical History:  Procedure Laterality Date   BIOPSY  08/08/2020   Procedure: BIOPSY;  Surgeon: Thornton Park, MD;  Location: [facility];  Service: Endoscopy;;   COLONOSCOPY WITH PROPOFOL N/A 08/08/2020   Procedure: COLONOSCOPY WITH PROPOFOL;  Surgeon: Thornton Park, MD;  Location: [facility];  Service: Endoscopy;  Laterality: N/A;   ECTOPIC PREGNANCY SURGERY     ESOPHAGOGASTRODUODENOSCOPY (EGD) WITH PROPOFOL N/A 08/08/2020   Procedure: ESOPHAGOGASTRODUODENOSCOPY (EGD) WITH PROPOFOL;  Surgeon: Thornton Park, MD;   Location: [facility];  Service: Endoscopy;  Laterality: N/A;   LAPAROSCOPIC APPENDECTOMY N/A 02/12/2013   Procedure: APPENDECTOMY LAPAROSCOPIC;  Surgeon: Gwenyth Ober, MD;  Location: [facility];  Service: General;  Laterality: N/A;   TUBAL LIGATION       OB History   No obstetric history on file.     Family History  Problem Relation Age of Onset   Thyroid disease Mother    Heart disease Mother    Colon polyps Father    Heart disease Father    Kidney disease Father    Breast cancer Sister    Crohn's disease Sister    Depression Maternal Grandfather    Glaucoma Maternal Grandfather    Ovarian cancer Maternal Aunt    Kidney disease Maternal Uncle     Social History   Tobacco Use   Smoking status: Former    Types: Cigarettes   Smokeless tobacco: Never  Vaping Use   Vaping Use: Never used  Substance Use Topics   Alcohol use: Yes    Alcohol/week: 12.0 standard drinks    Types: 12 Cans of beer per week   Drug use: No    Home Medications Prior to Admission medications   Medication Sig Start Date End Date Taking? Authorizing Provider  cyanocobalamin 2000 MCG tablet Take 2,000 mcg by mouth daily.    [provider]  DULoxetine (CYMBALTA) 60 MG  capsule Take 60 mg by mouth daily.    [provider]  escitalopram (LEXAPRO) 10 MG tablet Take 1 tablet by mouth daily. 07/25/20   [provider]  folic acid (FOLVITE) 1 MG tablet Take 1 tablet (1 mg total) by mouth daily. 08/09/20   Georgette Shell, MD  levothyroxine (UNITHROID) 125 MCG tablet Take 125 mcg by mouth daily before breakfast.    [provider]  REPATHA SURECLICK 378 MG/ML SOAJ Inject 140 mg into the skin every 14 (fourteen) days. 07/25/20   [provider]  rosuvastatin (CRESTOR) 20 MG tablet Take 20 mg by mouth daily.    [provider]    Allergies    Codeine, Dilaudid [hydromorphone hcl], Fentanyl, and Levothyroxine  Review of Systems   Review of Systems  All  other systems reviewed and are negative.  Physical Exam Updated Vital Signs BP (!) 161/81   Pulse 83   Temp 98.1 F (36.7 C) (Oral)   Resp (!) 22   Ht 5' (1.524 m)   Wt 68.5 kg   LMP 02/11/2021   SpO2 98%   BMI 29.49 kg/m   Physical Exam Vitals and nursing note reviewed.  Constitutional:      General: She is not in acute distress.    Appearance: She is well-developed.  HENT:     Head: Normocephalic and atraumatic.  Eyes:     Conjunctiva/sclera: Conjunctivae normal.  Cardiovascular:     Rate and Rhythm: Normal rate and regular rhythm.     Heart sounds: No murmur heard. Pulmonary:     Effort: Pulmonary effort is normal. No respiratory distress.     Breath sounds: Normal breath sounds.  Abdominal:     Palpations: Abdomen is soft.     Tenderness: There is no abdominal tenderness.  Musculoskeletal:        General: No swelling.     Cervical back: Neck supple.  Skin:    General: Skin is warm and dry.     Capillary Refill: Capillary refill takes less than 2 seconds.  Neurological:     Mental Status: She is alert.  Psychiatric:        Mood and Affect: Mood normal.    ED Results / Procedures / Treatments   Labs (all labs ordered are listed, but only abnormal results are displayed) Labs Reviewed  BASIC METABOLIC PANEL - Abnormal; Notable for the following components:      Result Value   Calcium 8.7 (*)    All other components within normal limits  CBC - Abnormal; Notable for the following components:   RBC 3.67 (*)    Hemoglobin 8.0 (*)    HCT 28.3 (*)    MCV 77.1 (*)    MCH 21.8 (*)    MCHC 28.3 (*)    RDW 17.7 (*)    All other components within normal limits  I-STAT BETA HCG BLOOD, ED ([facility], [facility], [facility] ONLY)  TROPONIN I (HIGH SENSITIVITY)  TROPONIN I (HIGH SENSITIVITY)    EKG None  Radiology DG Chest 2 View  Result Date: 02/16/2021 CLINICAL DATA:  Chest pain. EXAM: CHEST - 2 VIEW COMPARISON:  None. FINDINGS: The heart size and mediastinal contours are  within normal limits. Both lungs are clear. The visualized skeletal structures are unremarkable. IMPRESSION: No active cardiopulmonary disease. Electronically Signed   By: Ronney Asters M.D.   On: 02/16/2021 18:42    Procedures Procedures   Medications Ordered in ED Medications - No data to  display  ED Course  I have reviewed the triage vital signs and the nursing notes.  Pertinent labs & imaging results that were available during my care of the patient were reviewed by me and considered in my medical decision making (see chart for details).    MDM Rules/Calculators/A&P                           Sees Dr. Forde Dandy PCP. [facility].  Patient here with about a year of rectal bleeding and heavy vaginal bleeding.  She was seen back in May and had a blood transfusion when her hemoglobin dropped below 7.  She was notified by her endocrinologist office that her hemoglobin was 8.1 today and was sent to the emergency department.  While here, her vitals have been reassuring.  Hemoglobin is 8.0.  She states that she has not had any significant change in her bleeding, but that it has been persistent.  I suspect that it has been a slow steady decline in her hemoglobin.  She denies any chest pain, shortness of breath, lightheadedness, or dizziness right now.  No sign of symptomatic anemia.  She did have a very brief episode of chest pain yesterday, which resolved after a minute or so.  Troponins are 3 x 2 here tonight.  I did offer patient admission and further work-up in the hospital with GI consultation, but also feel that outpatient work-up is appropriate given reassuring vitals and no current need for emergent transfusion.  Patient is agreeable with this plan.  I have sent a epic message to her gastroenterologist.  She has a follow-up appointment with her PCP later this week.  Return precautions discussed. Final Clinical Impression(s) / ED Diagnoses Final diagnoses:  None    Rx / DC Orders ED  Discharge Orders     None        Montine Circle, PA-C 02/16/21 2234    Fredia Sorrow, MD 02/19/21 [phone]

## 2021-02-16 NOTE — Discharge Instructions (Signed)
Your hemoglobin was 8.0 tonight in the emergency department.  We generally give blood transfusions if it gets below 7.0.  This seems to have been steadily decreasing based on your description of the rectal bleeding and vaginal bleeding.  I recommend that you follow-up with your doctor.  You may need to see an OBGYN for consideration of hysterectomy.  I've sent an email to Dr. Tarri Glenn (GI) to see if they can schedule you for an appointment.  Return to the ER for new or worsening symptoms.

## 2021-03-10 ENCOUNTER — Ambulatory Visit: Payer: 59 | Admitting: Obstetrics and Gynecology

## 2021-03-10 ENCOUNTER — Other Ambulatory Visit: Payer: Self-pay

## 2021-03-10 ENCOUNTER — Other Ambulatory Visit (HOSPITAL_COMMUNITY)
Admission: RE | Admit: 2021-03-10 | Discharge: 2021-03-10 | Disposition: A | Discharge status: Home / Self Care | Payer: 59 | Source: Ambulatory Visit | Attending: Obstetrics and Gynecology | Admitting: Obstetrics and Gynecology

## 2021-03-10 ENCOUNTER — Encounter: Payer: Self-pay | Admitting: Obstetrics and Gynecology

## 2021-03-10 VITALS — BP 150/72 | Ht 60.0 in | Wt 152.0 lb

## 2021-03-10 DIAGNOSIS — Z124 Encounter for screening for malignant neoplasm of cervix: Secondary | ICD-10-CM

## 2021-03-10 DIAGNOSIS — Z8719 Personal history of other diseases of the digestive system: Secondary | ICD-10-CM | POA: Diagnosis not present

## 2021-03-10 DIAGNOSIS — L570 Actinic keratosis: Secondary | ICD-10-CM | POA: Insufficient documentation

## 2021-03-10 DIAGNOSIS — D2370 Other benign neoplasm of skin of unspecified lower limb, including hip: Secondary | ICD-10-CM | POA: Insufficient documentation

## 2021-03-10 DIAGNOSIS — D500 Iron deficiency anemia secondary to blood loss (chronic): Secondary | ICD-10-CM

## 2021-03-10 DIAGNOSIS — L739 Follicular disorder, unspecified: Secondary | ICD-10-CM | POA: Insufficient documentation

## 2021-03-10 DIAGNOSIS — N920 Excessive and frequent menstruation with regular cycle: Secondary | ICD-10-CM | POA: Diagnosis not present

## 2021-03-10 DIAGNOSIS — K625 Hemorrhage of anus and rectum: Secondary | ICD-10-CM

## 2021-03-10 DIAGNOSIS — Z01419 Encounter for gynecological examination (general) (routine) without abnormal findings: Secondary | ICD-10-CM | POA: Diagnosis not present

## 2021-03-10 DIAGNOSIS — L700 Acne vulgaris: Secondary | ICD-10-CM | POA: Insufficient documentation

## 2021-03-10 NOTE — Progress Notes (Signed)
55 y.o. No obstetric history on file. Married White or Caucasian Not Hispanic or Latino female referred by Dr Forde Dandy for menorrhagia. She was seen in the ER 02/16/21 c/o a h/o SOB and chest pain with a hgb of 8. Notes and labs were reviewed She has a h/o heavy period and persistent rectal bleeding for a year.  Menses are q 4 weeks, a few x a year her cycle will be a week late. She bleeds for 7 days, 2 days are heavy. She can saturate a super + tampon in 2 hours. No BTB. No cramps. No change in her cycles, have always been this heavy. No vasomotor symptoms.  Her rectal bleeding has been going on for over a year. Initially it was sporadic, in 1/22 it started daily. She would feel like she needed to have a BM, would go to the bathroom and would just pass blood and clots. Continues daily rectal bleeding.  She had a colonoscopy in May, was told she had hemorrhoids. She was transfused in May. She was referred to a surgeon, but never got the call.  BM are daily and soft, tan. She passes rectal clots at least 6 x a day. Bright red. No blood in her stool.   H/O upper endoscopy, reports mild esophageal irritation.   Sexually active, no pain.   No bladder c/o.   Overdue for an annual exam    Patient's last menstrual period was 02/11/2021.          Sexually active: Yes.    The current method of family planning is tubal ligation.    Exercising: Yes.    The patient does not participate in regular exercise at present. Smoker:  no  Health Maintenance: Pap:  unsure maybe 23 years ago  History of abnormal Pap:  no MMG:  at least 10 years  BMD:   none  Colonoscopy: 07/2020  TDaP:  unsure  Gardasil: none    reports that she has quit smoking. Her smoking use included cigarettes. She has never used smokeless tobacco. She reports current alcohol use of about 12.0 standard drinks per week. She reports that she does not use drugs. She drinks 12 drinks a day. She works part time for a [employer], [employer]. 3  kids, daughter is 5, twin boys are 54. No grandchildren.   Past Medical History:  Diagnosis Date   Alcohol abuse    Anemia    Anxiety    Blood transfusion without reported diagnosis    Depression    Heart murmur    Hyperlipidemia    Hyperthyroidism    s/p radioactive iodine therapy. resolved.   Secondary hypothyroidism     Past Surgical History:  Procedure Laterality Date   BIOPSY  08/08/2020   Procedure: BIOPSY;  Surgeon: Thornton Park, MD;  [facility];  Service: Endoscopy;;   COLONOSCOPY WITH PROPOFOL N/A 08/08/2020   Procedure: COLONOSCOPY WITH PROPOFOL;  Surgeon: Thornton Park, MD;  [facility];  Service: Endoscopy;  Laterality: N/A;   ECTOPIC PREGNANCY SURGERY     ESOPHAGOGASTRODUODENOSCOPY (EGD) WITH PROPOFOL N/A 08/08/2020   Procedure: ESOPHAGOGASTRODUODENOSCOPY (EGD) WITH PROPOFOL;  Surgeon: Thornton Park, MD;  [facility];  Service: Endoscopy;  Laterality: N/A;   LAPAROSCOPIC APPENDECTOMY N/A 02/12/2013   Procedure: APPENDECTOMY LAPAROSCOPIC;  Surgeon: Gwenyth Ober, MD;  Location: [facility];  Service: General;  Laterality: N/A;   TUBAL LIGATION      Current Outpatient Medications  Medication Sig Dispense Refill   cyanocobalamin 2000  MCG tablet Take 2,000 mcg by mouth daily.     DULoxetine (CYMBALTA) 60 MG capsule Take 60 mg by mouth daily.     escitalopram (LEXAPRO) 10 MG tablet Take 1 tablet by mouth daily.     folic acid (FOLVITE) 1 MG tablet Take 1 tablet (1 mg total) by mouth daily.     levothyroxine (SYNTHROID) 200 MCG tablet      REPATHA SURECLICK 376 MG/ML SOAJ Inject 140 mg into the skin every 14 (fourteen) days.     rosuvastatin (CRESTOR) 20 MG tablet Take 20 mg by mouth daily.     No current facility-administered medications for this visit.    Family History  Problem Relation Age of Onset   Thyroid disease Mother    Heart disease Mother    Colon polyps Father    Heart disease Father    Kidney disease  Father    Breast cancer Sister    Crohn's disease Sister    Ovarian cancer Maternal Aunt    Kidney disease Maternal Uncle    Depression Maternal Grandfather    Glaucoma Maternal Grandfather     Review of Systems  Genitourinary:  Positive for menstrual problem.  All other systems reviewed and are negative.  Exam:   BP (!) 150/72    Ht 5' (1.524 m)    Wt 152 lb (68.9 kg)    LMP 02/11/2021    BMI 29.69 kg/m   Weight change: @WEIGHTCHANGE @ Height:   Height: 5' (152.4 cm)  Ht Readings from Last 3 Encounters:  03/10/21 5' (1.524 m)  02/16/21 5' (1.524 m)  11/03/20 5' (1.524 m)    General appearance: alert, cooperative and appears stated age Head: Normocephalic, without obvious abnormality, atraumatic Neck: no adenopathy, supple, symmetrical, trachea midline and thyroid normal to inspection and palpation Lungs: clear to auscultation bilaterally Cardiovascular: regular rate and rhythm Breasts: normal appearance, no masses or tenderness Abdomen: soft, non-tender; non distended,  no masses,  no organomegaly Extremities: extremities normal, atraumatic, no cyanosis or edema Skin: Skin color, texture, turgor normal. No rashes or lesions Lymph nodes: Cervical, supraclavicular, and axillary nodes normal. No abnormal inguinal nodes palpated Neurologic: Grossly normal   Pelvic: External genitalia:  no lesions              Urethra:  normal appearing urethra with no masses, tenderness or lesions              Bartholins and Skenes: normal                 Vagina: normal appearing vagina with normal color and discharge, no lesions              Cervix: no lesions               Bimanual Exam:  Uterus:  normal size, contour, position, consistency, mobility, non-tender              Adnexa: no mass, fullness, tenderness               Rectovaginal: deferred  Gae Dry chaperoned for the exam.  1. Menorrhagia with regular cycle Life long heavy cycles, no change. Prior to the last year when she  started having rectal bleeding, she wasn't anemic.  - US PELVIS TRANSVAGINAL NON-OB (TV ONLY); Future -F/U after her ultrasound to discuss possible treatment options  2. Rectal bleeding Will refer to general surgeon   3. History of hemorrhoids Will refer to a general surgeon  4.  Iron deficiency anemia due to chronic blood loss Will refer to Hematology for an iron transfusion  5. Screening for cervical cancer - Cytology - PAP  6. Well woman exam Patient overdue for a mammogram, # given, she will schedule it Colonoscopy UTD  CC: Dr Forde Dandy

## 2021-03-11 ENCOUNTER — Encounter: Payer: Self-pay | Admitting: Obstetrics and Gynecology

## 2021-03-11 ENCOUNTER — Telehealth: Payer: Self-pay | Admitting: *Deleted

## 2021-03-11 ENCOUNTER — Telehealth: Payer: Self-pay | Admitting: Physician Assistant

## 2021-03-11 DIAGNOSIS — D500 Iron deficiency anemia secondary to blood loss (chronic): Secondary | ICD-10-CM

## 2021-03-11 NOTE — Telephone Encounter (Signed)
Scheduled appt per 12/21 referral. Pt is aware of appt date and time.

## 2021-03-11 NOTE — Telephone Encounter (Signed)
Referral message sent to [facility] to call and schedule. Referral placed at [facility] hematology to call and schedule.

## 2021-03-11 NOTE — Patient Instructions (Signed)

## 2021-03-11 NOTE — Telephone Encounter (Signed)
-----   Message from Salvadore Dom, MD sent at 03/10/2021  4:44 PM EST ----- Please schedule this patient a consultation for hemorrhoidectomy with [facility] and with Hematology for a consultation for iron transfusion.  Thanks,  Sharee Pimple

## 2021-03-12 ENCOUNTER — Other Ambulatory Visit: Payer: Self-pay | Admitting: *Deleted

## 2021-03-12 LAB — CYTOLOGY - PAP
Comment: NEGATIVE
Diagnosis: NEGATIVE
High risk HPV: NEGATIVE

## 2021-03-12 MED ORDER — FLUCONAZOLE 150 MG PO TABS: ORAL_TABLET | ORAL | 0 refills | Status: DC

## 2021-03-12 NOTE — Telephone Encounter (Signed)
Pt has an appt with Dr Johney Maine on 04/13/21 at 9:30 at [facility] Patient scheduled on 03/27/21 with hematology

## 2021-03-26 NOTE — Progress Notes (Signed)
[place] Telephone:(336) [phone]   Fax:([phone]) [place] NOTE  Patient Care Team: Reynold Bowen, MD as PCP - General (Endocrinology)   CHIEF COMPLAINTS/PURPOSE OF CONSULTATION:  Iron deficiency anemia  HISTORY OF PRESENTING ILLNESS:  Jill Olsen 56 y.o. female with medical history significant for anxiety, depression, iron deficiency anemia, hyperlipidemia, thyroid disorder, and alcohol abuse.  She is unaccompanied for this visit.  On review of the previous records, Jill Olsen was hospitalized in May 2022 due to severe iron deficiency anemia.  She received 2 units of PRBC and 1 dose of IV Feraheme.  Patient underwent endoscopy and colonoscopy on 08/08/2020.  Findings revealed bulky external hemorrhoids, some small internal hemorrhoids and a single gastric erosion.  On exam today, Jill Olsen has persistent fatigue that has been present for over a year.  Patient is able to complete her daily activities on her own but requires frequent resting.  She has a good appetite and denies any dietary restrictions.  Patient denies nausea, vomiting or abdominal pain.  She reports daily bowel movements without any diarrhea or constipation.  For over a year, patient has daily rectal bleeding with each bowel movement.  She is scheduled to see Dr. Johney Maine from surgery on 04/13/2021 to discuss interventions for bulky hemorrhoids.  Additionally, patient has menstrual bleeding once every 28 days.  She reports that each menstrual cycle last approximately 7 days with 2 to 3 days of heavy bleeding.  Patient requires wearing a pad and tampon and changes it every 2 hours.  She is currently not taking any birth control or medications to help slow down her menstrual bleeding.  She endorses craving for ice chips.  She denies fevers, chills, night sweats, shortness of breath, chest pain, headaches, dizziness or cough.  She has no other complaints.  Rest of the 10 point ROS is below  MEDICAL  HISTORY:  Past Medical History:  Diagnosis Date   Alcohol abuse    Anemia    Anxiety    Blood transfusion without reported diagnosis    Depression    Heart murmur    Hyperlipidemia    Hyperthyroidism    s/p radioactive iodine therapy. resolved.   Secondary hypothyroidism     SURGICAL HISTORY: Past Surgical History:  Procedure Laterality Date   BIOPSY  08/08/2020   Procedure: BIOPSY;  Surgeon: Thornton Park, MD;  Location: [facility];  Service: Endoscopy;;   COLONOSCOPY WITH PROPOFOL N/A 08/08/2020   Procedure: COLONOSCOPY WITH PROPOFOL;  Surgeon: Thornton Park, MD;  Location: [facility];  Service: Endoscopy;  Laterality: N/A;   ECTOPIC PREGNANCY SURGERY     ESOPHAGOGASTRODUODENOSCOPY (EGD) WITH PROPOFOL N/A 08/08/2020   Procedure: ESOPHAGOGASTRODUODENOSCOPY (EGD) WITH PROPOFOL;  Surgeon: Thornton Park, MD;  Location: [facility];  Service: Endoscopy;  Laterality: N/A;   LAPAROSCOPIC APPENDECTOMY N/A 02/12/2013   Procedure: APPENDECTOMY LAPAROSCOPIC;  Surgeon: Gwenyth Ober, MD;  Location: [facility];  Service: General;  Laterality: N/A;   TUBAL LIGATION      SOCIAL HISTORY: Social History   Socioeconomic History   Marital status: Married    Spouse name: Not on file   Number of children: 3   Years of education: Not on file   Highest education level: Not on file  Occupational History   Occupation: Book Therapist, nutritional  Tobacco Use   Smoking status: Former    Types: Cigarettes   Smokeless tobacco: Never  Vaping Use   Vaping Use: Never used  Substance and Sexual Activity  Alcohol use: Yes    Alcohol/week: 12.0 standard drinks    Types: 12 Cans of beer per week   Drug use: No   Sexual activity: Yes    Comment: post tubal ligation  Other Topics Concern   Not on file  Social History Narrative   Not on file   Social Determinants of Health   Financial Resource Strain: Not on file  Food Insecurity: Not on file  Transportation Needs: Not on file  Physical  Activity: Not on file  Stress: Not on file  Social Connections: Not on file  Intimate Partner Violence: Not on file    FAMILY HISTORY: Family History  Problem Relation Age of Onset   Thyroid disease Mother    Heart disease Mother    Colon polyps Father    Heart disease Father    Kidney disease Father    Breast cancer Sister    Crohn's disease Sister    Ovarian cancer Maternal Aunt    Kidney disease Maternal Uncle    Depression Maternal Grandfather    Glaucoma Maternal Grandfather     ALLERGIES:  is allergic to codeine, dilaudid [hydromorphone hcl], fentanyl, and levothyroxine.  MEDICATIONS:  Current Outpatient Medications  Medication Sig Dispense Refill   cyanocobalamin 2000 MCG tablet Take 2,000 mcg by mouth daily.     DULoxetine (CYMBALTA) 60 MG capsule Take 60 mg by mouth daily.     escitalopram (LEXAPRO) 10 MG tablet Take 1 tablet by mouth daily.     fluconazole (DIFLUCAN) 150 MG tablet Take 1 tablet by mouth now may repeat in 72 hours if still symptomatic. 2 tablet 0   folic acid (FOLVITE) 1 MG tablet Take 1 tablet (1 mg total) by mouth daily.     levothyroxine (SYNTHROID) 200 MCG tablet      REPATHA SURECLICK 194 MG/ML SOAJ Inject 140 mg into the skin every 14 (fourteen) days.     rosuvastatin (CRESTOR) 20 MG tablet Take 20 mg by mouth daily.     No current facility-administered medications for this visit.    REVIEW OF SYSTEMS:   Constitutional: ( - ) fevers, ( - )  chills , ( - ) night sweats Eyes: ( - ) blurriness of vision, ( - ) double vision, ( - ) watery eyes Ears, nose, mouth, throat, and face: ( - ) mucositis, ( - ) sore throat Respiratory: ( - ) cough, ( - ) dyspnea, ( - ) wheezes Cardiovascular: ( - ) palpitation, ( - ) chest discomfort, ( - ) lower extremity swelling Gastrointestinal:  ( - ) nausea, ( - ) heartburn, ( - ) change in bowel habits Skin: ( - ) abnormal skin rashes Lymphatics: ( - ) new lymphadenopathy, ( - ) easy bruising Neurological: ( -  ) numbness, ( - ) tingling, ( - ) new weaknesses Behavioral/Psych: ( - ) mood change, ( - ) new changes  All other systems were reviewed with the patient and are negative.  PHYSICAL EXAMINATION: ECOG PERFORMANCE STATUS: 1 - Symptomatic but completely ambulatory  There were no vitals filed for this visit. There were no vitals filed for this visit.  GENERAL: well appearing female in NAD  SKIN: skin color, texture, turgor are normal, no rashes or significant lesions EYES: conjunctiva are pink and non-injected, sclera clear OROPHARYNX: no exudate, no erythema; lips, buccal mucosa, and tongue normal  NECK: supple, non-tender LYMPH:  no palpable lymphadenopathy in the cervical or supraclavicular lymph nodes.  LUNGS: clear to auscultation  and percussion with normal breathing effort HEART: regular rate & rhythm and no murmurs and no lower extremity edema ABDOMEN: soft, non-tender, non-distended, normal bowel sounds Musculoskeletal: no cyanosis of digits and no clubbing  PSYCH: alert & oriented x 3, fluent speech NEURO: no focal motor/sensory deficits  LABORATORY DATA:  I have reviewed the data as listed CBC Latest Ref Rng & Units 02/16/2021 11/03/2020 08/08/2020  WBC 4.0 - 10.5 K/uL 6.1 6.2 5.2  Hemoglobin 12.0 - 15.0 g/dL 8.0(L) 11.5(L) 8.9(L)  Hematocrit 36.0 - 46.0 % 28.3(L) 35.4(L) 30.8(L)  Platelets 150 - 400 K/uL 319 285.0 298    CMP Latest Ref Rng & Units 02/16/2021 08/08/2020 08/07/2020  Glucose 70 - 99 mg/dL 88 107(H) 95  BUN 6 - 20 mg/dL 6 <5(L) 7  Creatinine 0.44 - 1.00 mg/dL 0.45 0.37(L) 0.49  Sodium 135 - 145 mmol/L 136 137 139  Potassium 3.5 - 5.1 mmol/L 3.6 3.9 3.4(L)  Chloride 98 - 111 mmol/L 100 106 104  CO2 22 - 32 mmol/L 26 23 25   Calcium 8.9 - 10.3 mg/dL 8.7(L) 8.4(L) 8.8(L)  Total Protein 6.5 - 8.1 g/dL - 7.4 -  Total Bilirubin 0.3 - 1.2 mg/dL - 0.5 -  Alkaline Phos 38 - 126 U/L - 69 -  AST 15 - 41 U/L - 36 -  ALT 0 - 44 U/L - 28 -   ASSESSMENT & PLAN JATAVIA KELTNER is a 56 y.o. female who presents for initial evaluation for iron deficiency anemia.  Patient is not taking iron pills as instructed by her PCP to prevent constipation.  Reviewed underlying causes of iron deficiency anemia including heavy menstrual bleeding and rectal bleeding from hemorrhoids.  She is scheduled to have a consultation with Dr. Johney Maine, general surgeon, to discuss interventions for hemorrhoids.  Patient will proceed with serologic studies today to check CBC, CMP, ferritin, iron and TIBC, reticulocyte panel.  If there is evidence of persistent iron deficiency anemia, we will recommend IV iron infusions.  Additionally, patient has history of vitamin B12 deficiency and is currently on oral vitamin B12 supplementation.  We will check vitamin B12 and methylmalonic acid levels today.  If there is persistent deficiency, we will recommend IM injections.  #Iron deficiency anemia 2/2 menstrual bleeding and rectal bleeding: --Rectal bleeding secondary to bulky hemorrhoids. Scheduled to see surgeon later this month to discuss surgical interventions. --Labs today to check CBC, CMP, ferritin, iron and TIBC and retic panel. --If there is persistent anemia, recommend IV iron infusions at [facility] infusion center.  --RTC in 8 weeks with repeat labs.   #Vitamin B12 deficiency: --Currently on oral supplements. --Check vitamin B12 and MMA levels today.  --Recommend IM injections if there is persistent deficiency.    No orders of the defined types were placed in this encounter.   All questions were answered. The patient knows to call the clinic with any problems, questions or concerns.  I have spent a total of 60 minutes minutes of face-to-face and non-face-to-face time, preparing to see the patient, obtaining and/or reviewing separately obtained history, performing a medically appropriate examination, counseling and educating the patient, ordering medications/tests, documenting clinical  information in the electronic health record, and care coordination.   Dede Query, PA-C Department of Hematology/Oncology [place] at [facility] Phone: [phone]

## 2021-03-27 ENCOUNTER — Telehealth: Payer: Self-pay

## 2021-03-27 ENCOUNTER — Other Ambulatory Visit: Payer: Self-pay | Admitting: Pharmacy Technician

## 2021-03-27 ENCOUNTER — Inpatient Hospital Stay: Payer: 59

## 2021-03-27 ENCOUNTER — Encounter (INDEPENDENT_AMBULATORY_CARE_PROVIDER_SITE_OTHER): Payer: Self-pay

## 2021-03-27 ENCOUNTER — Other Ambulatory Visit: Payer: Self-pay

## 2021-03-27 ENCOUNTER — Inpatient Hospital Stay: Payer: 59 | Attending: Physician Assistant | Admitting: Physician Assistant

## 2021-03-27 VITALS — BP 171/81 | HR 89 | Resp 17 | Wt 149.1 lb

## 2021-03-27 DIAGNOSIS — E785 Hyperlipidemia, unspecified: Secondary | ICD-10-CM | POA: Diagnosis not present

## 2021-03-27 DIAGNOSIS — F419 Anxiety disorder, unspecified: Secondary | ICD-10-CM | POA: Insufficient documentation

## 2021-03-27 DIAGNOSIS — Z803 Family history of malignant neoplasm of breast: Secondary | ICD-10-CM | POA: Diagnosis not present

## 2021-03-27 DIAGNOSIS — D398 Neoplasm of uncertain behavior of other specified female genital organs: Secondary | ICD-10-CM | POA: Insufficient documentation

## 2021-03-27 DIAGNOSIS — D508 Other iron deficiency anemias: Secondary | ICD-10-CM

## 2021-03-27 DIAGNOSIS — K625 Hemorrhage of anus and rectum: Secondary | ICD-10-CM | POA: Insufficient documentation

## 2021-03-27 DIAGNOSIS — F1010 Alcohol abuse, uncomplicated: Secondary | ICD-10-CM | POA: Insufficient documentation

## 2021-03-27 DIAGNOSIS — K648 Other hemorrhoids: Secondary | ICD-10-CM | POA: Insufficient documentation

## 2021-03-27 DIAGNOSIS — F32A Depression, unspecified: Secondary | ICD-10-CM | POA: Diagnosis not present

## 2021-03-27 DIAGNOSIS — R011 Cardiac murmur, unspecified: Secondary | ICD-10-CM | POA: Diagnosis not present

## 2021-03-27 DIAGNOSIS — Z8041 Family history of malignant neoplasm of ovary: Secondary | ICD-10-CM | POA: Diagnosis not present

## 2021-03-27 DIAGNOSIS — E039 Hypothyroidism, unspecified: Secondary | ICD-10-CM | POA: Diagnosis not present

## 2021-03-27 DIAGNOSIS — E538 Deficiency of other specified B group vitamins: Secondary | ICD-10-CM | POA: Diagnosis not present

## 2021-03-27 DIAGNOSIS — N920 Excessive and frequent menstruation with regular cycle: Secondary | ICD-10-CM | POA: Diagnosis present

## 2021-03-27 DIAGNOSIS — D500 Iron deficiency anemia secondary to blood loss (chronic): Secondary | ICD-10-CM | POA: Diagnosis present

## 2021-03-27 LAB — CMP (CANCER CENTER ONLY)
ALT: 28 U/L (ref 0–44)
AST: 37 U/L (ref 15–41)
Albumin: 4.2 g/dL (ref 3.5–5.0)
Alkaline Phosphatase: 84 U/L (ref 38–126)
Anion gap: 9 (ref 5–15)
BUN: <5 mg/dL — ABNORMAL LOW (ref 6–20)
CO2: 28 mmol/L (ref 22–32)
Calcium: 9.2 mg/dL (ref 8.9–10.3)
Chloride: 101 mmol/L (ref 98–111)
Creatinine: 0.5 mg/dL (ref 0.44–1.00)
GFR, Estimated: >60 mL/min (ref >60)
Glucose, Bld: 91 mg/dL (ref 70–99)
Potassium: 3.8 mmol/L (ref 3.5–5.1)
Sodium: 138 mmol/L (ref 135–145)
Total Bilirubin: 0.4 mg/dL (ref 0.3–1.2)
Total Protein: 8.2 g/dL — ABNORMAL HIGH (ref 6.5–8.1)

## 2021-03-27 LAB — IRON AND IRON BINDING CAPACITY (CC-WL,HP ONLY)
Iron: 10 ug/dL — ABNORMAL LOW (ref 28–170)
Saturation Ratios: 2 % — ABNORMAL LOW (ref 10.4–31.8)
TIBC: 462 ug/dL — ABNORMAL HIGH (ref 250–450)
UIBC: 452 ug/dL — ABNORMAL HIGH (ref 148–442)

## 2021-03-27 LAB — RETIC PANEL
Immature Retic Fract: 20.1 % — ABNORMAL HIGH (ref 2.3–15.9)
RBC.: 3.65 MIL/uL — ABNORMAL LOW (ref 3.87–5.11)
Retic Count, Absolute: 43.4 10*3/uL (ref 19.0–186.0)
Retic Ct Pct: 1.2 % (ref 0.4–3.1)
Reticulocyte Hemoglobin: 16.9 pg — ABNORMAL LOW (ref >27.9)

## 2021-03-27 LAB — CBC WITH DIFFERENTIAL (CANCER CENTER ONLY)
Abs Immature Granulocytes: 0.01 10*3/uL (ref 0.00–0.07)
Basophils Absolute: 0.1 10*3/uL (ref 0.0–0.1)
Basophils Relative: 1 %
Eosinophils Absolute: 0.1 10*3/uL (ref 0.0–0.5)
Eosinophils Relative: 2 %
HCT: 25.5 % — ABNORMAL LOW (ref 36.0–46.0)
Hemoglobin: 7.1 g/dL — ABNORMAL LOW (ref 12.0–15.0)
Immature Granulocytes: 0 %
Lymphocytes Relative: 20 %
Lymphs Abs: 1 10*3/uL (ref 0.7–4.0)
MCH: 19.6 pg — ABNORMAL LOW (ref 26.0–34.0)
MCHC: 27.8 g/dL — ABNORMAL LOW (ref 30.0–36.0)
MCV: 70.4 fL — ABNORMAL LOW (ref 80.0–100.0)
Monocytes Absolute: 0.6 10*3/uL (ref 0.1–1.0)
Monocytes Relative: 11 %
Neutro Abs: 3.2 10*3/uL (ref 1.7–7.7)
Neutrophils Relative %: 66 %
Platelet Count: 316 10*3/uL (ref 150–400)
RBC: 3.62 MIL/uL — ABNORMAL LOW (ref 3.87–5.11)
RDW: 18.8 % — ABNORMAL HIGH (ref 11.5–15.5)
WBC Count: 4.9 10*3/uL (ref 4.0–10.5)
nRBC: 0 % (ref 0.0–0.2)

## 2021-03-27 LAB — FERRITIN: Ferritin: 5 ng/mL — ABNORMAL LOW (ref 11–307)

## 2021-03-27 LAB — VITAMIN B12: Vitamin B-12: 2297 pg/mL — ABNORMAL HIGH (ref 180–914)

## 2021-03-27 NOTE — Telephone Encounter (Signed)
Please notify patient that Hgb is 7.1. today. If her symptoms are stable, we will get her in on Monday 1/9 to start IV iron at [facility].  If she is very dizzy or short of breath, then head to ED for blood transfusion  Spoke with pt and and she said she is stable for now but is having sob.  Wants to wait until Monday to have a transfusion.  She said if she has any problems she will go to the ED

## 2021-03-29 LAB — METHYLMALONIC ACID, SERUM: Methylmalonic Acid, Quantitative: 70 nmol/L (ref 0–378)

## 2021-03-30 ENCOUNTER — Telehealth: Payer: Self-pay | Admitting: Physician Assistant

## 2021-03-30 ENCOUNTER — Other Ambulatory Visit: Payer: Self-pay

## 2021-03-30 ENCOUNTER — Ambulatory Visit (INDEPENDENT_AMBULATORY_CARE_PROVIDER_SITE_OTHER): Payer: 59

## 2021-03-30 VITALS — BP 153/83 | HR 81 | Temp 98.5°F | Resp 16 | Ht 60.0 in | Wt 151.6 lb

## 2021-03-30 DIAGNOSIS — D508 Other iron deficiency anemias: Secondary | ICD-10-CM

## 2021-03-30 MED ORDER — FAMOTIDINE IN NACL 20-0.9 MG/50ML-% IV SOLN: 20.0000 mg | Freq: Once | INTRAVENOUS | Status: DC | PRN

## 2021-03-30 MED ORDER — SODIUM CHLORIDE 0.9 % IV SOLN
200.0000 mg | Freq: Once | INTRAVENOUS | Status: AC
  Administered 2021-03-30: 200 mg via INTRAVENOUS
  Filled 2021-03-30: qty 10

## 2021-03-30 MED ORDER — DIPHENHYDRAMINE HCL 50 MG/ML IJ SOLN: 50.0000 mg | Freq: Once | INTRAMUSCULAR | Status: DC | PRN

## 2021-03-30 MED ORDER — METHYLPREDNISOLONE SODIUM SUCC 125 MG IJ SOLR: 125.0000 mg | Freq: Once | INTRAMUSCULAR | Status: DC | PRN

## 2021-03-30 MED ORDER — EPINEPHRINE 0.3 MG/0.3ML IJ SOAJ: 0.3000 mg | Freq: Once | INTRAMUSCULAR | Status: DC | PRN

## 2021-03-30 MED ORDER — ALBUTEROL SULFATE HFA 108 (90 BASE) MCG/ACT IN AERS: 2.0000 | INHALATION_SPRAY | Freq: Once | RESPIRATORY_TRACT | Status: DC | PRN

## 2021-03-30 MED ORDER — SODIUM CHLORIDE 0.9 % IV SOLN: Freq: Once | INTRAVENOUS | Status: DC | PRN

## 2021-03-30 NOTE — Progress Notes (Signed)
Diagnosis: Iron Deficiency Anemia  Provider:  Marshell Garfinkel, MD  Procedure: Infusion  IV Type: Peripheral, IV Location: R Antecubital  Venofer (Iron Sucrose), Dose: 200 mg  Infusion Start Time: 0919  Infusion Stop Time: 0940  Post Infusion IV Care: Observation period completed and Peripheral IV Discontinued  Discharge: Condition: Good, Destination: Home . AVS provided to patient.   Performed by:  Koren Shiver, RN

## 2021-03-30 NOTE — Telephone Encounter (Signed)
Scheduled per 1/6 los, message has been left with pt

## 2021-04-06 ENCOUNTER — Ambulatory Visit (INDEPENDENT_AMBULATORY_CARE_PROVIDER_SITE_OTHER): Payer: 59

## 2021-04-06 ENCOUNTER — Other Ambulatory Visit: Payer: Self-pay

## 2021-04-06 VITALS — BP 143/68 | HR 85 | Temp 98.4°F | Resp 18 | Ht 64.0 in | Wt 149.2 lb

## 2021-04-06 DIAGNOSIS — D508 Other iron deficiency anemias: Secondary | ICD-10-CM | POA: Diagnosis not present

## 2021-04-06 MED ORDER — METHYLPREDNISOLONE SODIUM SUCC 125 MG IJ SOLR: 125.0000 mg | Freq: Once | INTRAMUSCULAR | Status: DC | PRN

## 2021-04-06 MED ORDER — FAMOTIDINE IN NACL 20-0.9 MG/50ML-% IV SOLN: 20.0000 mg | Freq: Once | INTRAVENOUS | Status: DC | PRN

## 2021-04-06 MED ORDER — SODIUM CHLORIDE 0.9 % IV SOLN
200.0000 mg | Freq: Once | INTRAVENOUS | Status: AC
  Administered 2021-04-06: 200 mg via INTRAVENOUS
  Filled 2021-04-06: qty 10

## 2021-04-06 MED ORDER — SODIUM CHLORIDE 0.9 % IV SOLN: Freq: Once | INTRAVENOUS | Status: DC | PRN

## 2021-04-06 MED ORDER — EPINEPHRINE 0.3 MG/0.3ML IJ SOAJ: 0.3000 mg | Freq: Once | INTRAMUSCULAR | Status: DC | PRN

## 2021-04-06 MED ORDER — ALBUTEROL SULFATE HFA 108 (90 BASE) MCG/ACT IN AERS: 2.0000 | INHALATION_SPRAY | Freq: Once | RESPIRATORY_TRACT | Status: DC | PRN

## 2021-04-06 MED ORDER — DIPHENHYDRAMINE HCL 50 MG/ML IJ SOLN: 50.0000 mg | Freq: Once | INTRAMUSCULAR | Status: DC | PRN

## 2021-04-06 NOTE — Progress Notes (Signed)
Diagnosis: Iron Deficiency Anemia  Provider:  Marshell Garfinkel, MD  Procedure: Infusion  IV Type: Peripheral, IV Location: L Antecubital  Venofer (Iron Sucrose), Dose: 200 mg  Infusion Start Time: 0928  Infusion Stop Time: 7445  Post Infusion IV Care: Peripheral IV Discontinued  Discharge: Condition: Good, Destination: Home . AVS provided to patient.   Performed by:  Charlie Pitter, RN

## 2021-04-07 ENCOUNTER — Ambulatory Visit: Payer: 59 | Admitting: Obstetrics and Gynecology

## 2021-04-07 ENCOUNTER — Ambulatory Visit (INDEPENDENT_AMBULATORY_CARE_PROVIDER_SITE_OTHER): Payer: 59

## 2021-04-07 ENCOUNTER — Encounter: Payer: Self-pay | Admitting: Obstetrics and Gynecology

## 2021-04-07 VITALS — BP 160/78 | HR 76 | Ht 60.0 in | Wt 149.0 lb

## 2021-04-07 DIAGNOSIS — N920 Excessive and frequent menstruation with regular cycle: Secondary | ICD-10-CM

## 2021-04-07 DIAGNOSIS — N83202 Unspecified ovarian cyst, left side: Secondary | ICD-10-CM

## 2021-04-07 NOTE — Progress Notes (Signed)
GYNECOLOGY  VISIT   HPI: 56 y.o.   Married White or Caucasian Not Hispanic or Latino  female   [phone] with No LMP recorded.   here for evaluation of menorrhagia.   She is also being seen by General Surgery for bleeding hemorrhoids. Her rectal bleeding is heavier than her vaginal bleeding, but she developed significant anemia at the end of last year.    Please see office visit from 03/10/21  GYNECOLOGIC HISTORY: No LMP recorded. Contraception:Tubal ligation  Menopausal hormone therapy: none         OB History     Gravida  4   Para  2   Term  2   Preterm      AB  2   Living  3      SAB      IAB      Ectopic  2   Multiple  1   Live Births  3              Patient Active Problem List   Diagnosis Date Noted   Acne vulgaris 03/10/2021   Actinic keratosis 03/10/2021   Benign neoplasm of skin of lower limb 76/19/5093   Folliculitis 26/71/2458   Hyperlipidemia 10/31/2020   Disease of thyroid gland 10/31/2020   Iron deficiency anemia    Rectal bleeding    Symptomatic anemia 08/06/2020   Secondary hypothyroidism    Depression     Past Medical History:  Diagnosis Date   Alcohol abuse    Anemia    Anxiety    Blood transfusion without reported diagnosis    Depression    Heart murmur    Hyperlipidemia    Hyperthyroidism    s/p radioactive iodine therapy. resolved.   Secondary hypothyroidism     Past Surgical History:  Procedure Laterality Date   BIOPSY  08/08/2020   Procedure: BIOPSY;  Surgeon: Thornton Park, MD;  Location: [facility];  Service: Endoscopy;;   COLONOSCOPY WITH PROPOFOL N/A 08/08/2020   Procedure: COLONOSCOPY WITH PROPOFOL;  Surgeon: Thornton Park, MD;  Location: [facility];  Service: Endoscopy;  Laterality: N/A;   ECTOPIC PREGNANCY SURGERY     ESOPHAGOGASTRODUODENOSCOPY (EGD) WITH PROPOFOL N/A 08/08/2020   Procedure: ESOPHAGOGASTRODUODENOSCOPY (EGD) WITH PROPOFOL;  Surgeon: Thornton Park, MD;  Location: [facility];   Service: Endoscopy;  Laterality: N/A;   LAPAROSCOPIC APPENDECTOMY N/A 02/12/2013   Procedure: APPENDECTOMY LAPAROSCOPIC;  Surgeon: Gwenyth Ober, MD;  Location: [facility];  Service: General;  Laterality: N/A;   TUBAL LIGATION      Current Outpatient Medications  Medication Sig Dispense Refill   cyanocobalamin 2000 MCG tablet Take 2,000 mcg by mouth daily.     DULoxetine (CYMBALTA) 60 MG capsule Take 60 mg by mouth daily.     escitalopram (LEXAPRO) 10 MG tablet Take 1 tablet by mouth daily.     fluconazole (DIFLUCAN) 150 MG tablet Take 1 tablet by mouth now may repeat in 72 hours if still symptomatic. 2 tablet 0   folic acid (FOLVITE) 1 MG tablet Take 1 tablet (1 mg total) by mouth daily.     levothyroxine (SYNTHROID) 200 MCG tablet      REPATHA SURECLICK 099 MG/ML SOAJ Inject 140 mg into the skin every 14 (fourteen) days.     rosuvastatin (CRESTOR) 20 MG tablet Take 20 mg by mouth daily.     No current facility-administered medications for this visit.     ALLERGIES: Codeine, Dilaudid [hydromorphone hcl], Fentanyl, and Levothyroxine  Family History  Problem Relation Age of Onset   Thyroid disease Mother    Heart disease Mother    Colon polyps Father    Heart disease Father    Kidney disease Father    Breast cancer Sister    Crohn's disease Sister    Ovarian cancer Maternal Aunt    Kidney disease Maternal Uncle    Depression Maternal Grandfather    Glaucoma Maternal Grandfather     Social History   Socioeconomic History   Marital status: Married    Spouse name: Not on file   Number of children: 3   Years of education: Not on file   Highest education level: Not on file  Occupational History   Occupation: Clinical cytogeneticist  Tobacco Use   Smoking status: Former    Types: Cigarettes   Smokeless tobacco: Never  Vaping Use   Vaping Use: Never used  Substance and Sexual Activity   Alcohol use: Yes    Alcohol/week: 12.0 standard drinks    Types: 12 Cans of beer per week   Drug  use: No   Sexual activity: Yes    Comment: post tubal ligation  Other Topics Concern   Not on file  Social History Narrative   Not on file   Social Determinants of Health   Financial Resource Strain: Not on file  Food Insecurity: Not on file  Transportation Needs: Not on file  Physical Activity: Not on file  Stress: Not on file  Social Connections: Not on file  Intimate Partner Violence: Not on file    ROS  PHYSICAL EXAMINATION:    There were no vitals taken for this visit.    General appearance: alert, cooperative and appears stated age  Pelvic ultrasound  Indications: Menorrhagia  Findings:  Uterus 8.78 x 5.59 x 4.56 cm, anteverted uterus No myometrial masses  Endometrium 4.94 mm No obvious masses, avascular  Left ovary 3.63 x 2.63 x 2.43 cm Left ovarian cyst 1.87 x 1.56 cm, there is a 1 cm solid component in the cyst with blood flow  Right ovary 3.30 x 2.29 x 1.85 cm  No free fluid  Impression:  Normal sized uterus Normal appearing endometrium Complex cystic mass in the left ovary with blood flow, concerning for neoplasm Normal right ovary No free fluid  1. Left ovarian cyst Complex cystic mass with blood flow, concerning for neoplasm - CA 125 -Referral to GYN Oncology  2. Menorrhagia with regular cycle Normal appearing uterus and endometrium   CC: Dr Forde Dandy

## 2021-04-08 ENCOUNTER — Other Ambulatory Visit: Payer: Self-pay

## 2021-04-08 ENCOUNTER — Telehealth: Payer: Self-pay

## 2021-04-08 DIAGNOSIS — N83299 Other ovarian cyst, unspecified side: Secondary | ICD-10-CM

## 2021-04-08 LAB — CA 125: CA 125: 9 U/mL (ref <35)

## 2021-04-08 NOTE — Telephone Encounter (Signed)
Jill Dom, MD  [facility] Triage  Please place referral to GYN Oncology, Dr Berline Lopes.  This patient has a vascular, complex ovarian cyst concerning for cancer. She is very worried, please try and get her in as soon as possible.  She is out of town from 1/26- 1/30, but will cancel her trip if she needs to in order to be seen.

## 2021-04-08 NOTE — Telephone Encounter (Signed)
Referral request placed. Info noted in referral regarding diagnosis and scheduling request.

## 2021-04-09 ENCOUNTER — Encounter: Payer: Self-pay | Admitting: Gynecologic Oncology

## 2021-04-09 ENCOUNTER — Telehealth: Payer: Self-pay | Admitting: *Deleted

## 2021-04-09 NOTE — Telephone Encounter (Signed)
Patient called the toffice back and scheduled a new patient appt for tomorrow with Dr Berline Lopes. Patient aware of the address/phone number and policy for the clinic.

## 2021-04-09 NOTE — Telephone Encounter (Signed)
Attempted to reach the patient to schedule a new patient appt with Dr Berline Lopes. LMOM for the patient to call the office back

## 2021-04-10 ENCOUNTER — Encounter: Payer: Self-pay | Admitting: Gynecologic Oncology

## 2021-04-10 ENCOUNTER — Inpatient Hospital Stay (HOSPITAL_BASED_OUTPATIENT_CLINIC_OR_DEPARTMENT_OTHER): Payer: 59 | Admitting: Gynecologic Oncology

## 2021-04-10 ENCOUNTER — Other Ambulatory Visit: Payer: Self-pay

## 2021-04-10 VITALS — BP 145/70 | HR 92 | Temp 98.3°F | Resp 16 | Ht 60.0 in | Wt 149.2 lb

## 2021-04-10 DIAGNOSIS — D398 Neoplasm of uncertain behavior of other specified female genital organs: Secondary | ICD-10-CM

## 2021-04-10 DIAGNOSIS — D649 Anemia, unspecified: Secondary | ICD-10-CM

## 2021-04-10 DIAGNOSIS — D500 Iron deficiency anemia secondary to blood loss (chronic): Secondary | ICD-10-CM

## 2021-04-10 DIAGNOSIS — K649 Unspecified hemorrhoids: Secondary | ICD-10-CM

## 2021-04-10 DIAGNOSIS — N9489 Other specified conditions associated with female genital organs and menstrual cycle: Secondary | ICD-10-CM

## 2021-04-10 NOTE — Progress Notes (Addendum)
GYNECOLOGIC ONCOLOGY NEW PATIENT CONSULTATION   Patient Name: Jill Olsen  Patient Age: 56 y.o. Date of Service: 04/10/2021 Referring Provider: Sumner Boast, MD  Primary Care Provider: Reynold Bowen, MD Consulting Provider: Jeral Pinch, MD   Assessment/Plan:  Premenopausal woman with iron-deficiency anemia secondary to blood loss from hemorrhoids incidentally found to have small complex ovarian mass.  I discussed recent ultrasound findings with the patient. There is a solid appearing portion of her adnexal mass with internal blood flow. Overall, my suspicion for malignancy is low. We discussed her normal CA-125. This is not a blood test that can be used for diagnostic purposes, but rather to help decide who should be taking the patient to the operating room once the decision for surgery has been made. While reassuring that it is normal, up to 50% of patients with early stage ovarian cancer have a normal CA 125.  I think it is reasonable to proceed with surgical excision although close imaging follow-up would be very reasonable.  Patient has significant iron deficiency anemia from ongoing rectal blood loss. She is seeing Dr. Johney Maine, on Monday for discussion of surgical interventions.  We discussed today that it would be reasonable to consider a joint procedure for removal of the one adnexa and contralateral fallopian tube.  Patient has family history of both breast and ovarian cancer.  Her sister, who had premenopausal breast cancer, had negative genetic testing.  Given her age, I think it would be reasonable to remove both fallopian tubes and ovaries at this time.  We discussed the risks and the benefits of bilateral oophorectomy.  The patient wishes to keep 1 ovary if it is normal-appearing at the time of surgery.  Plan will be for removal of the adnexa containing complex mass, placement in an Endo Catch bag with control drainage, and frozen section during surgery.  If benign, other ovary would  be left in situ.  In the setting of a borderline tumor, I discussed recommendation for completion surgery with BSO and total hysterectomy.  If malignancy encountered, then additional staging procedures such as lymph node biopsy and omentectomy would be undertaken.  Discussed endometrial biopsy for endometrial evaluation at the time of surgery given continued menses in the setting of significant anemia.  Plan will be for exam under anesthesia, endometrial biopsy, robotic unilateral salpingo-oophorectomy, contralateral salpingectomy, possible total hysterectomy and contralateral oophorectomy, possible staging, possible laparotomy. The risks of surgery were discussed in detail and she understands these to include infection; wound separation; hernia; vaginal cuff separation, injury to adjacent organs such as bowel, bladder, blood vessels, ureters and nerves; bleeding which may require blood transfusion; anesthesia risk; thromboembolic events; possible death; unforeseen complications; possible need for re-exploration; medical complications such as heart attack, stroke, pleural effusion and pneumonia; and, if full lymphadenectomy is performed the risk of lymphedema and lymphocyst. The patient will receive DVT and antibiotic prophylaxis as indicated. She voiced a clear understanding. She had the opportunity to ask questions.   She has significant and long history of alcohol use. Will need liver enzymes and PT/INR prior to surgery. If has to be admitted (unlikely from my portion of the procedure), she will need CIWA protocol.  Given the need to coordinate surgery with Dr. Johney Maine, we will have the patient return closer to the date of surgery for preoperative visit with [facility].  A copy of this note was sent to the patient's referring provider.   65 minutes of total time was spent for this patient encounter, including  preparation, face-to-face counseling with the patient and coordination of care, and  documentation of the encounter.   Jeral Pinch, MD  Division of Gynecologic Oncology  Department of Obstetrics and Gynecology  [facility]  ___________________________________________  Chief Complaint: No chief complaint on file.   History of Present Illness:  Jill Olsen is a 56 y.o. y.o. female who is seen in consultation at the request of Dr. Talbert Nan for an evaluation of complex adnexal mass.  The patient endorses rectal bleeding since October 2021.  She was hospitalized in May 2022 secondary to iron deficiency anemia, requiring blood transfusion and a dose of IV iron.  She underwent endoscopy and colonoscopy subsequently with findings of bulky external hemorrhoids, small internal hemorrhoids, diverticula, and nonbleeding gastric erosion.  She continues to have rectal bleeding multiple times a day for 3 weeks of the month.  Sometimes she will feel as if she needs to have a bowel movement and will pass only clots and bright red bleeding.  Other times she has bright red bleeding with her bowel movements.  On average, this happens 4-5 times a day.  Least 3 weeks of rectal bleeding occur the week before, during, and after her menses.  She was recently seen in the cancer center given iron deficiency anemia.  She saw Dede Query PA.  Given work-up, she is now undergoing repeat IV iron infusions; has had 2 so far, notes that stool is darker and more firm since starting infusions again.  She is scheduled to see Dr. Johney Maine on Monday regarding surgery for her hemorrhoids.  During work-up of her anemia, she saw Dr. Talbert Nan.  The patient continues to have regular menses and denies any intermenstrual bleeding.  She describes her bleeding is lasting approximately 6-7 days with 2 days of heavier bleeding, 2-3 days of light bleeding, and 2 days of heavy but dark brown bleeding.  She underwent pelvic ultrasound recently that showed uterus measuring 8.8 cm with a 5 mm endometrial  lining.  Left ovary measured up to 3.6 cm with a 1.9 cm cyst and a 1 cm solid component in the cyst with blood flow.  Right ovary measured up to 3.3 cm and was noted to be normal in appearance.  No free fluid.  CA-125 was checked and normal at 9.  Patient denies any pelvic or abdominal pain.  She notes about 3 weeks of left-sided low back aching.  She does not remember injuring this area.  She endorses a good appetite without nausea or emesis.  She notes ongoing fatigue.  She has increased shortness of breath during periods of anemia.  She endorses regular bowel movements other than what was previously described, denies any urinary symptoms.  Pelvic ultrasound exam on 1/17 showed uterus measuring 8.8 x 5.6 x 4.6 cm, endometrium is 4.9 mm and avascular.  Left ovary measures 3.6 x 2.6 x 2.4 cm with a 1.9 x 1.6 cm cystic lesion and a 1 cm solid component in the cyst with blood flow.  Right ovary 3.3 x 2.3 x 1.9 cm and normal in appearance.  No free fluid noted.  CA 125 was checked and normal, measuring 9.  Her family history is notable for breast cancer in her sister premenopausal.  Sister had genetic testing that was negative.  She also has a maternal aunt with a history of ovarian cancer.  Denies other GYN or colon cancer.  Her surgical history is notable for laparoscopic appendectomy, right tubal pregnancy that ruptured requiring  removal of that fallopian tube as well as a left tubal pregnancy that was treated with a salpingostomy.  Patient lives in [place] with her husband and son.  She is working part-time as a Radiation protection practitioner.  PAST MEDICAL HISTORY:  Past Medical History:  Diagnosis Date   Alcohol abuse    Anemia    Anxiety    Blood transfusion without reported diagnosis    Depression    Heart murmur    Hyperlipidemia    Hyperthyroidism    s/p radioactive iodine therapy. resolved.   Secondary hypothyroidism      PAST SURGICAL HISTORY:  Past Surgical History:  Procedure Laterality Date    BIOPSY  08/08/2020   Procedure: BIOPSY;  Surgeon: Thornton Park, MD;  Location: [facility];  Service: Endoscopy;;   COLONOSCOPY WITH PROPOFOL N/A 08/08/2020   Procedure: COLONOSCOPY WITH PROPOFOL;  Surgeon: Thornton Park, MD;  Location: [facility];  Service: Endoscopy;  Laterality: N/A;   ECTOPIC PREGNANCY SURGERY     x2, one with salpingectomy, other with salpingostomy   ESOPHAGOGASTRODUODENOSCOPY (EGD) WITH PROPOFOL N/A 08/08/2020   Procedure: ESOPHAGOGASTRODUODENOSCOPY (EGD) WITH PROPOFOL;  Surgeon: Thornton Park, MD;  Location: [facility];  Service: Endoscopy;  Laterality: N/A;   LAPAROSCOPIC APPENDECTOMY N/A 02/12/2013   Procedure: APPENDECTOMY LAPAROSCOPIC;  Surgeon: Gwenyth Ober, MD;  Location: [facility];  Service: General;  Laterality: N/A;   TUBAL LIGATION      OB/GYN HISTORY:  OB History  Gravida Para Term Preterm AB Living  4 2 2   2 3   SAB IAB Ectopic Multiple Live Births      2 1 3     # Outcome Date GA Lbr Len/2nd Weight Sex Delivery Anes PTL Lv  4A Term     M Vag-Spont   LIV  4B Term     M Vag-Spont   LIV  3 Ectopic           2 Term     F Vag-Spont   LIV  1 Ectopic             Patient's last menstrual period was 03/13/2021.  Age at menarche: 89 Age at menopause: Not applicable Hx of HRT: Not applicable Hx of STDs: Denies Last pap: 2022-negative, high risk HPV negative History of abnormal pap smears: Denies  SCREENING STUDIES:  Last mammogram: 2009  Last colonoscopy: 2022  MEDICATIONS: Outpatient Encounter Medications as of 04/10/2021  Medication Sig   cyanocobalamin 2000 MCG tablet Take 2,000 mcg by mouth daily.   DULoxetine (CYMBALTA) 60 MG capsule Take 60 mg by mouth daily.   escitalopram (LEXAPRO) 10 MG tablet Take 1 tablet by mouth daily.   folic acid (FOLVITE) 1 MG tablet Take 1 tablet (1 mg total) by mouth daily.   levothyroxine (SYNTHROID) 200 MCG tablet    REPATHA SURECLICK 885 MG/ML SOAJ Inject 140 mg into the skin every 14  (fourteen) days.   rosuvastatin (CRESTOR) 20 MG tablet Take 20 mg by mouth daily.   No facility-administered encounter medications on file as of 04/10/2021.    ALLERGIES:  Allergies  Allergen Reactions   Codeine Itching    Lips numb   Dilaudid [Hydromorphone Hcl] Itching   Fentanyl Itching   Levothyroxine Other (See Comments)    headaches     FAMILY HISTORY:  Family History  Problem Relation Age of Onset   Thyroid disease Mother    Heart disease Mother    Colon polyps Father    Heart disease Father  Kidney disease Father    Breast cancer Sister    Crohn's disease Sister    Ovarian cancer Maternal Aunt    Kidney disease Maternal Uncle    Depression Maternal Grandfather    Glaucoma Maternal Grandfather    Prostate cancer Neg Hx    Endometrial cancer Neg Hx    Pancreatic cancer Neg Hx      SOCIAL HISTORY:  Social Connections: Not on file    REVIEW OF SYSTEMS:  Pertinent positives include fatigue, intermittent shortness of breath, itching, back pain, headache, anxiety, depression, decreased concentration. Denies appetite changes, fevers, chills, unexplained weight changes. Denies hearing loss, neck lumps or masses, mouth sores, ringing in ears or voice changes. Denies cough or wheezing.   Denies chest pain or palpitations. Denies leg swelling. Denies abdominal distention, pain, blood in stools, constipation, diarrhea, nausea, vomiting, or early satiety. Denies pain with intercourse, dysuria, frequency, hematuria or incontinence. Denies hot flashes, pelvic pain, vaginal bleeding or vaginal discharge.   Denies joint pain or muscle pain/cramps. Denies rash, or wounds. Denies dizziness, numbness or seizures. Denies swollen lymph nodes or glands, denies easy bruising or bleeding. Denies confusion.  Physical Exam:  Vital Signs for this encounter:  Blood pressure (!) 145/70, pulse 92, temperature 98.3 F (36.8 C), temperature source Oral, resp. rate 16, height 5'  (1.524 m), weight 149 lb 3.2 oz (67.7 kg), last menstrual period 03/13/2021, SpO2 100 %. Body mass index is 29.14 kg/m. General: Alert, oriented, no acute distress.  HEENT: Normocephalic, atraumatic. Sclera anicteric.  Chest: Clear to auscultation bilaterally. No wheezes, rhonchi, or rales. Cardiovascular: Regular rate and rhythm, no murmurs, rubs, or gallops.  Abdomen: Obese. Normoactive bowel sounds. Soft, nondistended, nontender to palpation. No masses or hepatosplenomegaly appreciated. No palpable fluid wave.  Multiple well-healed incisions. Extremities: Grossly normal range of motion. Warm, well perfused. No edema bilaterally.  Skin: No rashes or lesions.  Lymphatics: No cervical, supraclavicular, or inguinal adenopathy.  GU:  Normal external female genitalia.  No lesions. No discharge or bleeding.             Bladder/urethra:  No lesions or masses, well supported bladder             Vagina: Well rugated, no lesions or masses.  No bleeding or discharge.             Cervix: Normal appearing, no lesions.  Nabothian cyst on right aspect of the cervix.             Uterus: Small, mobile, no parametrial involvement or nodularity.             Adnexa: No masses appreciated.  Rectal: Deferred.  LABORATORY AND RADIOLOGIC DATA:  Outside medical records were reviewed to synthesize the above history, along with the history and physical obtained during the visit.   Lab Results  Component Value Date   WBC 4.9 03/27/2021   HGB 7.1 (L) 03/27/2021   HCT 25.5 (L) 03/27/2021   PLT 316 03/27/2021   GLUCOSE 91 03/27/2021   CHOL 164 08/08/2020   TRIG 13 08/08/2020   HDL 83 08/08/2020   LDLCALC 78 08/08/2020   ALT 28 03/27/2021   AST 37 03/27/2021   NA 138 03/27/2021   K 3.8 03/27/2021   CL 101 03/27/2021   CREATININE 0.50 03/27/2021   BUN <5 (L) 03/27/2021   CO2 28 03/27/2021   INR 1.0 08/06/2020

## 2021-04-10 NOTE — Patient Instructions (Signed)
Plan to meet with Dr. Johney Maine as scheduled. We will arrange for a joint surgical procedure. Once the date for surgery has been decided upon, we will have you come back to the office to have a preop with Florene Brill NP to go over the surgery details.

## 2021-04-11 ENCOUNTER — Encounter: Payer: Self-pay | Admitting: Physician Assistant

## 2021-04-13 ENCOUNTER — Ambulatory Visit: Payer: 59

## 2021-04-13 ENCOUNTER — Other Ambulatory Visit: Payer: Self-pay

## 2021-04-13 ENCOUNTER — Ambulatory Visit: Payer: Self-pay | Admitting: Surgery

## 2021-04-13 ENCOUNTER — Encounter: Payer: Self-pay | Admitting: Surgery

## 2021-04-13 ENCOUNTER — Ambulatory Visit (INDEPENDENT_AMBULATORY_CARE_PROVIDER_SITE_OTHER): Payer: 59

## 2021-04-13 VITALS — BP 167/78 | HR 76 | Temp 99.0°F | Resp 16 | Ht 60.0 in | Wt 151.0 lb

## 2021-04-13 DIAGNOSIS — K3189 Other diseases of stomach and duodenum: Secondary | ICD-10-CM | POA: Insufficient documentation

## 2021-04-13 DIAGNOSIS — D508 Other iron deficiency anemias: Secondary | ICD-10-CM | POA: Diagnosis not present

## 2021-04-13 MED ORDER — METHYLPREDNISOLONE SODIUM SUCC 125 MG IJ SOLR: 125.0000 mg | Freq: Once | INTRAMUSCULAR | Status: DC | PRN

## 2021-04-13 MED ORDER — SODIUM CHLORIDE 0.9 % IV SOLN: Freq: Once | INTRAVENOUS | Status: DC | PRN

## 2021-04-13 MED ORDER — SODIUM CHLORIDE 0.9 % IV SOLN
200.0000 mg | Freq: Once | INTRAVENOUS | Status: AC
  Administered 2021-04-13: 200 mg via INTRAVENOUS
  Filled 2021-04-13: qty 10

## 2021-04-13 MED ORDER — ALBUTEROL SULFATE HFA 108 (90 BASE) MCG/ACT IN AERS: 2.0000 | INHALATION_SPRAY | Freq: Once | RESPIRATORY_TRACT | Status: DC | PRN

## 2021-04-13 MED ORDER — EPINEPHRINE 0.3 MG/0.3ML IJ SOAJ: 0.3000 mg | Freq: Once | INTRAMUSCULAR | Status: DC | PRN

## 2021-04-13 MED ORDER — FAMOTIDINE IN NACL 20-0.9 MG/50ML-% IV SOLN: 20.0000 mg | Freq: Once | INTRAVENOUS | Status: DC | PRN

## 2021-04-13 MED ORDER — DIPHENHYDRAMINE HCL 50 MG/ML IJ SOLN: 50.0000 mg | Freq: Once | INTRAMUSCULAR | Status: DC | PRN

## 2021-04-13 NOTE — Progress Notes (Signed)
Diagnosis: Iron Deficiency Anemia  Provider:  Marshell Garfinkel, MD  Procedure: Infusion  IV Type: Peripheral, IV Location: L Forearm  Venofer (Iron Sucrose), Dose: 200 mg  Infusion Start Time: 11.03 04/13/2021  Infusion Stop Time: 11.25 04/13/2021  Post Infusion IV Care: Peripheral IV Discontinued  Discharge: Condition: Good, Destination: Home . AVS provided to patient.   Performed by:  Arnoldo Morale, RN

## 2021-04-14 NOTE — Telephone Encounter (Signed)
New patient appt with Dr. Berline Lopes was 04/10/21.

## 2021-04-16 ENCOUNTER — Telehealth: Payer: Self-pay

## 2021-04-16 NOTE — Telephone Encounter (Signed)
Following up with Jill Olsen this afternoon regarding her upcoming surgery. Advised patient we are looking at surgery for mid February. Patient states Dr. Johney Maine' office mentioned surgery at the end of February beginning of March and is requesting cardiac clearance. Cardiology appointment has been scheduled for 04/28/21 at 2pm. Advised patient that our office will work on coordinating surgery with Dr. Johney Maine and we will be in touch. Patient verbalized understanding.

## 2021-04-21 ENCOUNTER — Other Ambulatory Visit: Payer: Self-pay

## 2021-04-21 ENCOUNTER — Ambulatory Visit (INDEPENDENT_AMBULATORY_CARE_PROVIDER_SITE_OTHER): Payer: 59

## 2021-04-21 VITALS — BP 159/87 | HR 82 | Temp 98.6°F | Resp 16 | Ht 60.0 in | Wt 151.0 lb

## 2021-04-21 DIAGNOSIS — D508 Other iron deficiency anemias: Secondary | ICD-10-CM | POA: Diagnosis not present

## 2021-04-21 MED ORDER — DIPHENHYDRAMINE HCL 50 MG/ML IJ SOLN: 50.0000 mg | Freq: Once | INTRAMUSCULAR | Status: DC | PRN

## 2021-04-21 MED ORDER — ALBUTEROL SULFATE HFA 108 (90 BASE) MCG/ACT IN AERS: 2.0000 | INHALATION_SPRAY | Freq: Once | RESPIRATORY_TRACT | Status: DC | PRN

## 2021-04-21 MED ORDER — SODIUM CHLORIDE 0.9 % IV SOLN
200.0000 mg | Freq: Once | INTRAVENOUS | Status: AC
  Administered 2021-04-21: 200 mg via INTRAVENOUS
  Filled 2021-04-21: qty 10

## 2021-04-21 MED ORDER — FAMOTIDINE IN NACL 20-0.9 MG/50ML-% IV SOLN: 20.0000 mg | Freq: Once | INTRAVENOUS | Status: DC | PRN

## 2021-04-21 MED ORDER — EPINEPHRINE 0.3 MG/0.3ML IJ SOAJ: 0.3000 mg | Freq: Once | INTRAMUSCULAR | Status: DC | PRN

## 2021-04-21 MED ORDER — SODIUM CHLORIDE 0.9 % IV SOLN: Freq: Once | INTRAVENOUS | Status: DC | PRN

## 2021-04-21 MED ORDER — METHYLPREDNISOLONE SODIUM SUCC 125 MG IJ SOLR: 125.0000 mg | Freq: Once | INTRAMUSCULAR | Status: DC | PRN

## 2021-04-21 NOTE — Progress Notes (Signed)
Diagnosis: Iron Deficiency Anemia  Provider:  Marshell Garfinkel, MD  Procedure: Infusion  IV Type: Peripheral, IV Location: R Antecubital  Venofer (Iron Sucrose), Dose: 200 mg  Infusion Start Time: 0919  Infusion Stop Time: 0939  Post Infusion IV Care: Peripheral IV Discontinued  Discharge: Condition: Good, Destination: Home . AVS provided to patient.   Performed by:  Koren Shiver, RN

## 2021-04-27 ENCOUNTER — Other Ambulatory Visit: Payer: Self-pay

## 2021-04-27 ENCOUNTER — Ambulatory Visit (INDEPENDENT_AMBULATORY_CARE_PROVIDER_SITE_OTHER): Payer: 59

## 2021-04-27 VITALS — BP 156/80 | HR 71 | Temp 98.4°F | Resp 16 | Ht 60.0 in | Wt 149.4 lb

## 2021-04-27 DIAGNOSIS — D508 Other iron deficiency anemias: Secondary | ICD-10-CM

## 2021-04-27 MED ORDER — SODIUM CHLORIDE 0.9 % IV SOLN: Freq: Once | INTRAVENOUS | Status: DC | PRN

## 2021-04-27 MED ORDER — METHYLPREDNISOLONE SODIUM SUCC 125 MG IJ SOLR: 125.0000 mg | Freq: Once | INTRAMUSCULAR | Status: DC | PRN

## 2021-04-27 MED ORDER — EPINEPHRINE 0.3 MG/0.3ML IJ SOAJ: 0.3000 mg | Freq: Once | INTRAMUSCULAR | Status: DC | PRN

## 2021-04-27 MED ORDER — DIPHENHYDRAMINE HCL 50 MG/ML IJ SOLN: 50.0000 mg | Freq: Once | INTRAMUSCULAR | Status: DC | PRN

## 2021-04-27 MED ORDER — ALBUTEROL SULFATE HFA 108 (90 BASE) MCG/ACT IN AERS: 2.0000 | INHALATION_SPRAY | Freq: Once | RESPIRATORY_TRACT | Status: DC | PRN

## 2021-04-27 MED ORDER — SODIUM CHLORIDE 0.9 % IV SOLN
200.0000 mg | Freq: Once | INTRAVENOUS | Status: AC
  Administered 2021-04-27: 200 mg via INTRAVENOUS
  Filled 2021-04-27: qty 10

## 2021-04-27 MED ORDER — FAMOTIDINE IN NACL 20-0.9 MG/50ML-% IV SOLN: 20.0000 mg | Freq: Once | INTRAVENOUS | Status: DC | PRN

## 2021-04-27 NOTE — Progress Notes (Signed)
Diagnosis: Iron Deficiency Anemia  Provider:  Marshell Garfinkel, MD  Procedure: Infusion  IV Type: Peripheral, IV Location: L Antecubital  Venofer (Iron Sucrose), Dose: 200 mg  Infusion Start Time: 09.07 04/27/2021  Infusion Stop Time: 09.24 2/6/ 2023  Post Infusion IV Care: Peripheral IV Discontinued  Discharge: Condition: Good, Destination: Home . AVS provided to patient.   Performed by:  Arnoldo Morale, RN

## 2021-04-28 ENCOUNTER — Telehealth: Payer: Self-pay

## 2021-04-28 ENCOUNTER — Encounter: Payer: Self-pay | Admitting: Cardiology

## 2021-04-28 ENCOUNTER — Ambulatory Visit: Payer: 59 | Admitting: Cardiology

## 2021-04-28 ENCOUNTER — Ambulatory Visit: Payer: 59 | Admitting: Internal Medicine

## 2021-04-28 VITALS — BP 122/62 | HR 87 | Ht 60.0 in | Wt 151.0 lb

## 2021-04-28 DIAGNOSIS — Z01818 Encounter for other preprocedural examination: Secondary | ICD-10-CM

## 2021-04-28 DIAGNOSIS — Z01810 Encounter for preprocedural cardiovascular examination: Secondary | ICD-10-CM | POA: Diagnosis not present

## 2021-04-28 DIAGNOSIS — E782 Mixed hyperlipidemia: Secondary | ICD-10-CM

## 2021-04-28 NOTE — Telephone Encounter (Signed)
Patient called to inform us that she went to the Cardiologist for clearance. She is cleared for surgery and was wanting to schedule a surgery date. Notified [name], NP and she said we will call her back with a date for surgery. Patient verbalized understanding.

## 2021-04-28 NOTE — Progress Notes (Signed)
Cardiology Office Note:    Date:  04/28/2021   ID:  Jill Olsen, DOB [birth year], MRN [id number]  PCP:  Reynold Bowen, MD  Cardiologist:  Donato Heinz, MD  Electrophysiologist:  None   Referring MD: Michael Boston, MD   Chief Complaint  Patient presents with   New Patient (Initial Visit)        Pre-op Exam    History of Present Illness:    Jill Olsen is a 56 y.o. female with a hx of alcohol abuse, anemia, hyperlipidemia, hypothyroidism status post radioactive iodine now with hypothyroidism, hemorrhoids who is referred by Dr. Berenice Primas for preop evaluation.  She reports that she has been having issues with rectal bleeding for over 1 year due to hemorrhoids.  Planning surgery for hemorrhoids and also was found to have ovarian mass which will be resected during procedure.  Reports she walks twice per week for about 20 minutes.  She reports that she has been having some dyspnea when she is anemic, but otherwise denies any shortness of breath or chest pain.  Reports that when her blood counts are good, she does not have any shortness of breath with exertion.  She denies any lightheadedness, syncope, lower extremity edema, or palpitations.  She can walk up 2 flights of stairs without stopping.  Smoked for about 5 years in her 79s, about 0.5 packs/day.  Father had MI in 42s.    Past Medical History:  Diagnosis Date   Alcohol abuse    Anemia    Anxiety    Blood transfusion without reported diagnosis    Depression    Heart murmur    Hyperlipidemia    Hyperthyroidism    s/p radioactive iodine therapy. resolved.   Secondary hypothyroidism     Past Surgical History:  Procedure Laterality Date   BIOPSY  08/08/2020   Procedure: BIOPSY;  Surgeon: Thornton Park, MD;  Location: [facility];  Service: Endoscopy;;   COLONOSCOPY WITH PROPOFOL N/A 08/08/2020   Procedure: COLONOSCOPY WITH PROPOFOL;  Surgeon: Thornton Park, MD;  Location: [facility];  Service: Endoscopy;   Laterality: N/A;   ECTOPIC PREGNANCY SURGERY     x2, one with salpingectomy, other with salpingostomy   ESOPHAGOGASTRODUODENOSCOPY (EGD) WITH PROPOFOL N/A 08/08/2020   Procedure: ESOPHAGOGASTRODUODENOSCOPY (EGD) WITH PROPOFOL;  Surgeon: Thornton Park, MD;  Location[facility];  Service: Endoscopy;  Laterality: N/A;   LAPAROSCOPIC APPENDECTOMY N/A 02/12/2013   Procedure: APPENDECTOMY LAPAROSCOPIC;  Surgeon: Gwenyth Ober, MD;  Location: [facility];  Service: General;  Laterality: N/A;   TUBAL LIGATION      Current Medications: Current Meds  Medication Sig   cyanocobalamin 2000 MCG tablet Take 2,000 mcg by mouth daily.   DULoxetine (CYMBALTA) 60 MG capsule Take 60 mg by mouth daily.   escitalopram (LEXAPRO) 10 MG tablet Take 1 tablet by mouth daily.   folic acid (FOLVITE) 1 MG tablet Take 1 tablet (1 mg total) by mouth daily.   levothyroxine (SYNTHROID) 200 MCG tablet    REPATHA SURECLICK 322 MG/ML SOAJ Inject 140 mg into the skin every 14 (fourteen) days.   rosuvastatin (CRESTOR) 20 MG tablet Take 20 mg by mouth daily.     Allergies:   Codeine, Dilaudid [hydromorphone hcl], Fentanyl, and Levothyroxine   Social History   Socioeconomic History   Marital status: Married    Spouse name: Not on file   Number of children: 3   Years of education: Not on file   Highest education level: Not on  file  Occupational History   Occupation: Clinical cytogeneticist  Tobacco Use   Smoking status: Former    Types: Cigarettes   Smokeless tobacco: Never  Vaping Use   Vaping Use: Never used  Substance and Sexual Activity   Alcohol use: Yes    Alcohol/week: 12.0 standard drinks    Types: 12 Cans of beer per week   Drug use: No   Sexual activity: Yes    Comment: post tubal ligation  Other Topics Concern   Not on file  Social History Narrative   Not on file   Social Determinants of Health   Financial Resource Strain: Not on file  Food Insecurity: Not on file  Transportation Needs: Not on file   Physical Activity: Not on file  Stress: Not on file  Social Connections: Not on file     Family History: The patient's family history includes Breast cancer in her sister; Colon polyps in her father; Crohn's disease in her sister; Depression in her maternal grandfather; Glaucoma in her maternal grandfather; Heart disease in her father and mother; Kidney disease in her father and maternal uncle; Ovarian cancer in her maternal aunt; Thyroid disease in her mother. There is no history of Prostate cancer, Endometrial cancer, or Pancreatic cancer.  ROS:   Please see the history of present illness.     All other systems reviewed and are negative.  EKGs/Labs/Other Studies Reviewed:    The following studies were reviewed today:   EKG:   04/28/2021: Normal sinus rhythm, rate 87, no ST abnormality  Recent Labs: 08/07/2020: Magnesium 2.0 03/27/2021: ALT 28; BUN <5; Creatinine 0.50; Hemoglobin 7.1; Platelet Count 316; Potassium 3.8; Sodium 138  Recent Lipid Panel    Component Value Date/Time   CHOL 164 08/08/2020 0040   TRIG 13 08/08/2020 0040   HDL 83 08/08/2020 0040   CHOLHDL 2.0 08/08/2020 0040   VLDL 3 08/08/2020 0040   LDLCALC 78 08/08/2020 0040    Physical Exam:    VS:  BP 122/62 (BP Location: Left Arm, Patient Position: Sitting, Cuff Size: Normal)    Pulse 87    Ht 5' (1.524 m)    Wt 151 lb (68.5 kg)    BMI 29.49 kg/m     Wt Readings from Last 3 Encounters:  04/28/21 151 lb (68.5 kg)  04/27/21 149 lb 6.4 oz (67.8 kg)  04/21/21 151 lb (68.5 kg)     GEN:  Well nourished, well developed in no acute distress HEENT: Normal NECK: No JVD; No carotid bruits LYMPHATICS: No lymphadenopathy CARDIAC: RRR, no murmurs, rubs, gallops RESPIRATORY:  Clear to auscultation without rales, wheezing or rhonchi  ABDOMEN: Soft, non-tender, non-distended MUSCULOSKELETAL:  No edema; No deformity  SKIN: Warm and dry NEUROLOGIC:  Alert and oriented x 3 PSYCHIATRIC:  Normal affect   ASSESSMENT:     1. Pre-op evaluation   2. Mixed hyperlipidemia    PLAN:    Preop evaluation: Planning surgery for hemorrhoids and ovarian mass.  Good functional capacity, greater than 4 METS.  Denies any exertional symptoms except has had dyspnea when she has been anemic.  RCRI score 0.  Overall would classify as low risk for an intermediate risk surgery and no further cardiac work-up recommended prior to procedure  Hyperlipidemia: LDL 83 on 02/16/2021, on rosuvastatin 10 mg daily and Repatha.  We will check calcium score to guide how aggressive to be in lowering cholesterol   RTC in 1 year   Medication Adjustments/Labs and Tests Ordered: Current medicines  are reviewed at length with the patient today.  Concerns regarding medicines are outlined above.  Orders Placed This Encounter  Procedures   CT CARDIAC SCORING (SELF PAY ONLY)   EKG 12-Lead   No orders of the defined types were placed in this encounter.   Patient Instructions   Testing/Procedures:  CORONARY CALCIUM SCORING CT SCAN AT THE [facility]   Follow-Up: At [facility], you and your health needs are our priority.  As part of our continuing mission to provide you with exceptional heart care, we have created designated Provider Care Teams.  These Care Teams include your primary Cardiologist (physician) and Advanced Practice Providers (APPs -  Physician Assistants and Nurse Practitioners) who all work together to provide you with the care you need, when you need it.  We recommend signing up for the patient portal called "MyChart".  Sign up information is provided on this After Visit Summary.  MyChart is used to connect with patients for Virtual Visits (Telemedicine).  Patients are able to view lab/test results, encounter notes, upcoming appointments, etc.  Non-urgent messages can be sent to your provider as well.   To learn more about what you can do with MyChart, go to [email or web address].    Your next appointment:   12  month(s)  The format for your next appointment:   In Person  Provider:   Oswaldo Milian MD       Signed, Donato Heinz, MD  04/28/2021 4:29 PM    [place]

## 2021-04-28 NOTE — Patient Instructions (Signed)
°  Testing/Procedures:  CORONARY CALCIUM SCORING CT SCAN AT THE [facility]   Follow-Up: At [facility], you and your health needs are our priority.  As part of our continuing mission to provide you with exceptional heart care, we have created designated Provider Care Teams.  These Care Teams include your primary Cardiologist (physician) and Advanced Practice Providers (APPs -  Physician Assistants and Nurse Practitioners) who all work together to provide you with the care you need, when you need it.  We recommend signing up for the patient portal called "MyChart".  Sign up information is provided on this After Visit Summary.  MyChart is used to connect with patients for Virtual Visits (Telemedicine).  Patients are able to view lab/test results, encounter notes, upcoming appointments, etc.  Non-urgent messages can be sent to your provider as well.   To learn more about what you can do with MyChart, go to [email or web address].    Your next appointment:   12 month(s)  The format for your next appointment:   In Person  Provider:   Oswaldo Milian MD

## 2021-04-30 ENCOUNTER — Telehealth: Payer: Self-pay

## 2021-04-30 NOTE — Telephone Encounter (Signed)
Spoke with Jill Olsen this afternoon. Discussed that surgery with Dr. Berline Lopes and Dr. Johney Maine is scheduled for 05/20/21. Patient verbalized understanding. Pre-op appointment scheduled with [name], NP for 05/05/21 at 2:00 pm. Patient is in agreement of appointment date and time. Instructed to call with any needs.

## 2021-05-04 NOTE — Patient Instructions (Addendum)
Preparing for your Surgery  Plan for surgery on May 20, 2021 with Dr. Jeral Pinch at [facility] will be scheduled for exam under anesthesia, endometrial biopsy (biopsy taken from the lining of the uterus), robotic unilateral oophorectomy (removal of the ovary with the mass), bilateral salpingectomy (removal of both fallopian tubes), possible total hysterectomy (removal of the uterus and cervix) and contralateral oophorectomy (removal of the other ovary), possible staging, possible laparotomy.   Pre-operative Testing - (Done, on 2/16) You will receive a phone call from presurgical testing at [facility] to arrange for a pre-operative appointment and lab work.  -Bring your insurance card, copy of an advanced directive if applicable, medication list  -At that visit, you will be asked to sign a consent for a possible blood transfusion in case a transfusion becomes necessary during surgery.  The need for a blood transfusion is rare but having consent is a necessary part of your care.     -You should not be taking blood thinners or aspirin at least ten days prior to surgery unless instructed by your surgeon.  -Do not take supplements such as fish oil (omega 3), red yeast rice, turmeric before your surgery. You want to avoid medications with aspirin in them including headache powders such as BC or Goody's), Excedrin migraine.  Day Before Surgery at [place] will be asked to take in a light diet the day before surgery. You will be advised you can have clear liquids up until 3 hours before your surgery.    Eat a light diet the day before surgery.  Examples including soups, broths, toast, yogurt, mashed potatoes.  AVOID GAS PRODUCING FOODS. Things to avoid include carbonated beverages (fizzy beverages, sodas), raw fruits and raw vegetables (uncooked), or beans.   If your bowels are filled with gas, your surgeon will have difficulty visualizing your pelvic organs which  increases your surgical risks.  Your role in recovery Your role is to become active as soon as directed by your doctor, while still giving yourself time to heal.  Rest when you feel tired. You will be asked to do the following in order to speed your recovery:  - Cough and breathe deeply. This helps to clear and expand your lungs and can prevent pneumonia after surgery.  - [place]. Do mild physical activity. Walking or moving your legs help your circulation and body functions return to normal. Do not try to get up or walk alone the first time after surgery.   -If you develop swelling on one leg or the other, pain in the back of your leg, redness/warmth in one of your legs, please call the office or go to the Emergency Room to have a doppler to rule out a blood clot. For shortness of breath, chest pain-seek care in the Emergency Room as soon as possible. - Actively manage your pain. Managing your pain lets you move in comfort. We will ask you to rate your pain on a scale of zero to 10. It is your responsibility to tell your doctor or nurse where and how much you hurt so your pain can be treated.  Special Considerations -If you are diabetic, you may be placed on insulin after surgery to have closer control over your blood sugars to promote healing and recovery.  This does not mean that you will be discharged on insulin.  If applicable, your oral antidiabetics will be resumed when you are tolerating a solid diet.  -  Your final pathology results from surgery should be available around one week after surgery and the results will be relayed to you when available.  -Dr. Lahoma Crocker is the surgeon that assists your GYN Oncologist with surgery.  If you end up staying the night, the next day after your surgery you will either see Dr. Berline Lopes or Dr. Lahoma Crocker.  -FMLA forms can be faxed to [phone] and please allow 5-7 business days for completion.  Pain Management After  Surgery -You have been prescribed your pain medication and bowel regimen medications before surgery so that you can have these available when you are discharged from the hospital. The pain medication is for use ONLY AFTER surgery and a new prescription will not be given.   -Make sure that you have Tylenol and Ibuprofen (would use sparingly, ibup use with your lexapro and cymbalta can increase your risk for gastrointestinal bleeding, take with food) at home to use on a regular basis after surgery for pain control. We recommend alternating the medications every hour to six hours since they work differently and are processed in the body differently for pain relief.  -Review the attached handout on narcotic use and their risks and side effects.   Bowel Regimen -You have been prescribed Sennakot-S to take nightly to prevent constipation especially if you are taking the narcotic pain medication intermittently.  It is important to prevent constipation and drink adequate amounts of liquids. You can stop taking this medication when you are not taking pain medication and you are back on your normal bowel routine.  Risks of Surgery Risks of surgery are low but include bleeding, infection, damage to surrounding structures, re-operation, blood clots, and very rarely death.   Blood Transfusion Information (For the consent to be signed before surgery)  We will be checking your blood type before surgery so in case of emergencies, we will know what type of blood you would need.                                            WHAT IS A BLOOD TRANSFUSION?  A transfusion is the replacement of blood or some of its parts. Blood is made up of multiple cells which provide different functions. Red blood cells carry oxygen and are used for blood loss replacement. White blood cells fight against infection. Platelets control bleeding. Plasma helps clot blood. Other blood products are available for specialized needs, such as  hemophilia or other clotting disorders. BEFORE THE TRANSFUSION  Who gives blood for transfusions?  You may be able to donate blood to be used at a later date on yourself (autologous donation). Relatives can be asked to donate blood. This is generally not any safer than if you have received blood from a stranger. The same precautions are taken to ensure safety when a relative's blood is donated. Healthy volunteers who are fully evaluated to make sure their blood is safe. This is blood bank blood. Transfusion therapy is the safest it has ever been in the practice of medicine. Before blood is taken from a donor, a complete history is taken to make sure that person has no history of diseases nor engages in risky social behavior (examples are intravenous drug use or sexual activity with multiple partners). The donor's travel history is screened to minimize risk of transmitting infections, such as malaria. The donated blood is tested for  signs of infectious diseases, such as HIV and hepatitis. The blood is then tested to be sure it is compatible with you in order to minimize the chance of a transfusion reaction. If you or a relative donates blood, this is often done in anticipation of surgery and is not appropriate for emergency situations. It takes many days to process the donated blood. RISKS AND COMPLICATIONS Although transfusion therapy is very safe and saves many lives, the main dangers of transfusion include:  Getting an infectious disease. Developing a transfusion reaction. This is an allergic reaction to something in the blood you were given. Every precaution is taken to prevent this. The decision to have a blood transfusion has been considered carefully by your caregiver before blood is given. Blood is not given unless the benefits outweigh the risks.  AFTER SURGERY INSTRUCTIONS  Return to work: 4-6 weeks if applicable  Activity: 1. Be up and out of the bed during the day.  Take a nap if needed.   You may walk up steps but be careful and use the hand rail.  Stair climbing will tire you more than you think, you may need to stop part way and rest.   2. No lifting or straining for 6 weeks over 10 pounds. No pushing, pulling, straining for 6 weeks.  3. No driving for around 1 week(s).  Do not drive if you are taking narcotic pain medicine and make sure that your reaction time has returned.   4. You can shower as soon as the next day after surgery. Shower daily.  Use your regular soap and water (not directly on the incision) and pat your incision(s) dry afterwards; don't rub.  No tub baths or submerging your body in water until cleared by your surgeon. If you have the soap that was given to you by pre-surgical testing that was used before surgery, you do not need to use it afterwards because this can irritate your incisions.   5. No sexual activity and nothing in the vagina for 4 weeks. 8 weeks if you have a hysterectomy.  6. You may experience a small amount of clear drainage from your incisions, which is normal.  If the drainage persists, increases, or changes color please call the office.  7. Do not use creams, lotions, or ointments such as neosporin on your incisions after surgery until advised by your surgeon because they can cause removal of the dermabond glue on your incisions.    8. You may experience vaginal spotting after surgery or around the 6-8 week mark from surgery when the stitches at the top of the vagina begin to dissolve (if you have a hysterectomy).  The spotting is normal but if you experience heavy bleeding, call our office.  9. Take Tylenol or ibuprofen first for pain and only use narcotic pain medication for severe pain not relieved by the Tylenol or Ibuprofen.  Monitor your Tylenol intake to a max of 4,000 mg in a 24 hour period. You can alternate these medications after surgery.  Diet: 1. Low sodium Heart Healthy Diet is recommended but you are cleared to resume your  normal (before surgery) diet after your procedure.  2. It is safe to use a laxative, such as Miralax or Colace, if you have difficulty moving your bowels. You have been prescribed Sennakot-S to take at bedtime every evening after surgery to keep bowel movements regular and to prevent constipation.    Wound Care: 1. Keep clean and dry.  Shower daily.  Reasons to call the Doctor: Fever - Oral temperature greater than 100.4 degrees Fahrenheit Foul-smelling vaginal discharge Difficulty urinating Nausea and vomiting Increased pain at the site of the incision that is unrelieved with pain medicine. Difficulty breathing with or without chest pain New calf pain especially if only on one side Sudden, continuing increased vaginal bleeding with or without clots.   Contacts: For questions or concerns you should contact:  Dr. Jeral Pinch at [phone]  [name], NP at [phone]  After Hours: call [phone] and have the GYN Oncologist paged/contacted (after 5 pm or on the weekends).  Messages sent via mychart are for non-urgent matters and are not responded to after hours so for urgent needs, please call the after hours number.

## 2021-05-04 NOTE — Progress Notes (Signed)
Patient here for a pre-operative appointment prior to her scheduled surgery on May 20, 2021. She is scheduled for exam under anesthesia, endometrial biopsy, robotic unilateral salpingo-oophorectomy, contralateral salpingectomy, possible total hysterectomy and contralateral oophorectomy, possible staging, possible laparotomy. She has her pre-admission testing appointment on 05/07/21 at [facility].  The surgery was discussed in detail.  See after visit summary for additional details. Visual aids used to discuss items related to surgery including sequential compression stockings, foley catheter, IV pump, multi-modal pain regimen including tylenol, photo of the surgical robot, female reproductive system to discuss surgery in detail.      Discussed post-op pain management in detail including the aspects of the enhanced recovery pathway.  Advised her that a new prescription would be sent in for tramadol and it is only to be used for after her upcoming surgery. She does not feel she has taken tramadol before. Advised we could try this and if another medication needed to be sent in due to adverse reactions, we can send it in. We discussed the use of tylenol post-op and to monitor for a maximum of 4,000 mg in a 24 hour period.  Also prescribed sennakot to be used after surgery and to hold if having loose stools but advised this may be adjusted per Dr. Clyda Greener recommendations.  Discussed bowel regimen in detail. Also advised to use ibuprofen sparingly since it can increase risk of gastrointestinal bleeding with concurrent lexapro/cymbalta use.   Discussed the use of SCDs and measures to take at home to prevent DVT including frequent mobility.  Reportable signs and symptoms of DVT discussed. Post-operative instructions discussed and expectations for after surgery. Incisional care discussed as well including reportable signs and symptoms including erythema, drainage, wound separation.     10 minutes spent with the  patient.  Verbalizing understanding of material discussed. No needs or concerns voiced at the end of the visit.   Advised patient to call for any needs.  Advised that her post-operative medications had been prescribed and could be picked up at any time.    This appointment is included in the global surgical bundle as pre-operative teaching and has no charge.

## 2021-05-05 ENCOUNTER — Inpatient Hospital Stay: Payer: 59 | Attending: Physician Assistant | Admitting: Gynecologic Oncology

## 2021-05-05 ENCOUNTER — Other Ambulatory Visit: Payer: Self-pay

## 2021-05-05 VITALS — BP 164/66 | HR 102 | Temp 98.8°F | Resp 18 | Ht 61.02 in | Wt 153.8 lb

## 2021-05-05 DIAGNOSIS — N9489 Other specified conditions associated with female genital organs and menstrual cycle: Secondary | ICD-10-CM

## 2021-05-05 MED ORDER — TRAMADOL HCL 50 MG PO TABS: 50.0000 mg | ORAL_TABLET | Freq: Four times a day (QID) | ORAL | 0 refills | Status: DC | PRN

## 2021-05-05 MED ORDER — IBUPROFEN 800 MG PO TABS: 800.0000 mg | ORAL_TABLET | Freq: Three times a day (TID) | ORAL | 0 refills | Status: DC | PRN

## 2021-05-05 MED ORDER — SENNOSIDES-DOCUSATE SODIUM 8.6-50 MG PO TABS: 2.0000 | ORAL_TABLET | Freq: Every day | ORAL | 0 refills | Status: DC

## 2021-05-05 NOTE — Progress Notes (Addendum)
COVID swab appointment:  N/A  COVID Vaccine Completed:  Yes x2 Date COVID Vaccine completed: Has received booster: COVID vaccine manufacturer: Pfizer      Date of COVID positive in last 90 days:  No  PCP - Reynold Bowen, MD Cardiologist - Oswaldo Milian, MD.  Only saw cardiology for preop eval, no cardiac issues  Chest x-ray - 02-16-21 Epic EKG - 04-28-21 Epic Stress Test - N/A ECHO - N/A Cardiac Cath - N/A Pacemaker/ICD device last checked: Spinal Cord Stimulator:  Bowel Prep -   Milk of Magnesia.  Patient is aware  Sleep Study - N/A CPAP -   Fasting Blood Sugar - N/A Checks Blood Sugar _____ times a day  Blood Thinner Instructions:N/A Aspirin Instructions: Last Dose:  Activity level:   Can go up a flight of stairs and perform activities of daily living without stopping and without symptoms of chest pain or shortness of breath.  Able to exercise without symptoms   Anesthesia review: Pt states that Dr. Forde Dandy heard a murmur when she was in her 60s but has not heard since.  No  Echo done.  (Evaluated by Willeen Cass, NP)  Patient states that when severely anemic she will have shortness of breath.    Patient denies shortness of breath, fever, cough and chest pain at PAT appointment  Patient verbalized understanding of instructions that were given to them at the PAT appointment. Patient was also instructed that they will need to review over the PAT instructions again at home before surgery.

## 2021-05-05 NOTE — Patient Instructions (Addendum)
DUE TO COVID-19 ONLY ONE VISITOR IS ALLOWED TO COME WITH YOU AND STAY IN THE WAITING ROOM ONLY DURING PRE OP AND PROCEDURE.   **NO VISITORS ARE ALLOWED IN THE SHORT STAY AREA OR RECOVERY ROOM!!**   You are not required to quarantine, however you are required to wear a well-fitted mask when you are out and around people not in your household.  Hand Hygiene often Do NOT share personal items Notify your provider if you are in close contact with someone who has COVID or you develop fever 100.4 or greater, new onset of sneezing, cough, sore throat, shortness of breath or body aches.   Your procedure is scheduled on: Wednesday, 05-20-21   Report to [facility] Main  Entrance     Report to admitting at 9:45 AM   Call this number if you have problems the morning of surgery [phone]   BOWEL PREP INSTRUCTIONS for Anal/Rectal Surgery:   Obtain a bottle of Milk of Magnesia at a pharmacy      DAY PRIOR TO SURGERY:   Switch to drinking liquids or pureed foods only  Drink plenty of liquids all day to avoid getting dehydrated   10:00am: Take 2 oz (4 tablespoons) Milk of Magnesia.   2:00pm:  Take 2 oz (4 tablespoons) Milk of Magnesia.   Midnight:  Do not eat anything solid after bedtime (midnight) the night before your surgery.  BUT DO drink plenty of clear liquids (Water, Gatorade, juice, soda, coffee, tea, broths, etc.) up to 2 hours prior to surgery to avoid getting dehydrated.   MORNING OF SURGERY   Remember to not to eat anything solid that morning  Hold or take medications as recommended by the hospital staff at your Preoperative visit   Stop drinking liquids before you leave the house (>3 hours prior to surgery)   If you have questions or problems,  please call [place]  to speak to someone in the clinic department at our office   Do not eat food :After Midnight.   May have liquids until 9:00 AM day of surgery  CLEAR LIQUID DIET  Foods  Allowed                                                                     Foods Excluded  Water, Black Coffee (no milk/no creamer) and tea, regular and decaf                              liquids that you cannot  Plain Jell-O in any flavor  (No red)                         see through such as: Fruit ices (not with fruit pulp)                                 milk, soups, orange juice  Iced Popsicles (No red)  All solid food                             Apple juices Sports drinks like Gatorade (No red) Lightly seasoned clear broth or consume(fat free) Sugar    Complete one Ensure drink the morning of surgery at 9:00 AM the day of surgery.       The day of surgery:  Drink ONE (1) Pre-Surgery Clear Ensure  the morning of surgery. Drink in one sitting. Do not sip.  This drink was given to you during your hospital  pre-op appointment visit. Nothing else to drink after completing the Pre-Surgery Clear Ensure          If you have questions, please contact your surgeons office.     Oral Hygiene is also important to reduce your risk of infection.                                    Remember - BRUSH YOUR TEETH THE MORNING OF SURGERY WITH YOUR REGULAR TOOTHPASTE   Do NOT smoke after Midnight   Take these medicines the morning of surgery with A SIP OF WATER: Duloxetine, Escitalopram, Synthroid, Rosuvastatin   Stop all vitamins and herbal supplements a week before surgery.     Stop Motrin, Aleve, Ibuprofen a week before surgery.   You may not have any metal on your body including hair pins, jewelry, and body piercing             Do not wear make-up, lotions, powders, perfumes or deodorant  Do not wear nail polish including gel and S&S, artificial/acrylic nails, or any other type of covering on natural nails including finger and toenails. If you have artificial nails, gel coating, etc. that needs to be removed by a nail salon please have this removed prior to  surgery or surgery may need to be canceled/ delayed if the surgeon/ anesthesia feels like they are unable to be safely monitored.   Do not shave  48 hours prior to surgery.    Contacts, dentures or bridgework may not be worn into surgery.    Patients discharged the day of surgery will not be allowed to drive home.   A responsible adult must remain with you for 24 hours after surgery.  Please read over the following fact sheets you were given: IF YOU HAVE QUESTIONS ABOUT YOUR PRE OP INSTRUCTIONS PLEASE CALL [place] - Preparing for Surgery Before surgery, you can play an important role.  Because skin is not sterile, your skin needs to be as free of germs as possible.  You can reduce the number of germs on your skin by washing with CHG (chlorahexidine gluconate) soap before surgery.  CHG is an antiseptic cleaner which kills germs and bonds with the skin to continue killing germs even after washing. Please DO NOT use if you have an allergy to CHG or antibacterial soaps.  If your skin becomes reddened/irritated stop using the CHG and inform your nurse when you arrive at Short Stay. Do not shave (including legs and underarms) for at least 48 hours prior to the first CHG shower.  You may shave your face/neck.  Please follow these instructions carefully:  1.  Shower with CHG Soap the night before surgery and the  morning of surgery.  2.  If you choose to wash your  hair, wash your hair first as usual with your normal  shampoo.  3.  After you shampoo, rinse your hair and body thoroughly to remove the shampoo.                             4.  Use CHG as you would any other liquid soap.  You can apply chg directly to the skin and wash.  Gently with a scrungie or clean washcloth.  5.  Apply the CHG Soap to your body ONLY FROM THE NECK DOWN.   Do   not use on face/ open                           Wound or open sores. Avoid contact with eyes, ears mouth and   genitals (private parts).                        Wash face,  Genitals (private parts) with your normal soap.             6.  Wash thoroughly, paying special attention to the area where your    surgery  will be performed.  7.  Thoroughly rinse your body with warm water from the neck down.  8.  DO NOT shower/wash with your normal soap after using and rinsing off the CHG Soap.                9.  Pat yourself dry with a clean towel.            10.  Wear clean pajamas.            11.  Place clean sheets on your bed the night of your first shower and do not  sleep with pets. Day of Surgery : Do not apply any lotions/deodorants the morning of surgery.  Please wear clean clothes to the hospital/surgery center.  FAILURE TO FOLLOW THESE INSTRUCTIONS MAY RESULT IN THE CANCELLATION OF YOUR SURGERY  PATIENT SIGNATURE_________________________________  NURSE SIGNATURE__________________________________  ________________________________________________________________________  WHAT IS A BLOOD TRANSFUSION? Blood Transfusion Information  A transfusion is the replacement of blood or some of its parts. Blood is made up of multiple cells which provide different functions. Red blood cells carry oxygen and are used for blood loss replacement. White blood cells fight against infection. Platelets control bleeding. Plasma helps clot blood. Other blood products are available for specialized needs, such as hemophilia or other clotting disorders. BEFORE THE TRANSFUSION  Who gives blood for transfusions?  Healthy volunteers who are fully evaluated to make sure their blood is safe. This is blood bank blood. Transfusion therapy is the safest it has ever been in the practice of medicine. Before blood is taken from a donor, a complete history is taken to make sure that person has no history of diseases nor engages in risky social behavior (examples are intravenous drug use or sexual activity with multiple partners). The donor's travel history is  screened to minimize risk of transmitting infections, such as malaria. The donated blood is tested for signs of infectious diseases, such as HIV and hepatitis. The blood is then tested to be sure it is compatible with you in order to minimize the chance of a transfusion reaction. If you or a relative donates blood, this is often done in anticipation of surgery and is not appropriate for emergency situations. It takes many days  to process the donated blood. RISKS AND COMPLICATIONS Although transfusion therapy is very safe and saves many lives, the main dangers of transfusion include:  Getting an infectious disease. Developing a transfusion reaction. This is an allergic reaction to something in the blood you were given. Every precaution is taken to prevent this. The decision to have a blood transfusion has been considered carefully by your caregiver before blood is given. Blood is not given unless the benefits outweigh the risks. AFTER THE TRANSFUSION Right after receiving a blood transfusion, you will usually feel much better and more energetic. This is especially true if your red blood cells have gotten low (anemic). The transfusion raises the level of the red blood cells which carry oxygen, and this usually causes an energy increase. The nurse administering the transfusion will monitor you carefully for complications. HOME CARE INSTRUCTIONS  No special instructions are needed after a transfusion. You may find your energy is better. Speak with your caregiver about any limitations on activity for underlying diseases you may have. SEEK MEDICAL CARE IF:  Your condition is not improving after your transfusion. You develop redness or irritation at the intravenous (IV) site. SEEK IMMEDIATE MEDICAL CARE IF:  Any of the following symptoms occur over the next 12 hours: Shaking chills. You have a temperature by mouth above 102 F (38.9 C), not controlled by medicine. Chest, back, or muscle pain. People  around you feel you are not acting correctly or are confused. Shortness of breath or difficulty breathing. Dizziness and fainting. You get a rash or develop hives. You have a decrease in urine output. Your urine turns a dark color or changes to pink, red, or brown. Any of the following symptoms occur over the next 10 days: You have a temperature by mouth above 102 F (38.9 C), not controlled by medicine. Shortness of breath. Weakness after normal activity. The white part of the eye turns yellow (jaundice). You have a decrease in the amount of urine or are urinating less often. Your urine turns a dark color or changes to pink, red, or brown. Document Released: 03/05/2000 Document Revised: 05/31/2011 Document Reviewed: 10/23/2007 [employer] Patient Information 2014 [place], [place].  _______________________________________________________________________

## 2021-05-07 ENCOUNTER — Encounter (HOSPITAL_COMMUNITY): Payer: Self-pay

## 2021-05-07 ENCOUNTER — Encounter (HOSPITAL_COMMUNITY)
Admission: RE | Admit: 2021-05-07 | Discharge: 2021-05-07 | Disposition: A | Discharge status: Home / Self Care | Payer: 59 | Source: Ambulatory Visit | Attending: Gynecologic Oncology | Admitting: Gynecologic Oncology

## 2021-05-07 ENCOUNTER — Other Ambulatory Visit: Payer: Self-pay

## 2021-05-07 ENCOUNTER — Telehealth: Payer: Self-pay

## 2021-05-07 DIAGNOSIS — N839 Noninflammatory disorder of ovary, fallopian tube and broad ligament, unspecified: Secondary | ICD-10-CM | POA: Diagnosis not present

## 2021-05-07 DIAGNOSIS — Z01812 Encounter for preprocedural laboratory examination: Secondary | ICD-10-CM | POA: Diagnosis not present

## 2021-05-07 DIAGNOSIS — K642 Third degree hemorrhoids: Secondary | ICD-10-CM | POA: Insufficient documentation

## 2021-05-07 DIAGNOSIS — D500 Iron deficiency anemia secondary to blood loss (chronic): Secondary | ICD-10-CM | POA: Insufficient documentation

## 2021-05-07 DIAGNOSIS — E039 Hypothyroidism, unspecified: Secondary | ICD-10-CM | POA: Insufficient documentation

## 2021-05-07 DIAGNOSIS — N9489 Other specified conditions associated with female genital organs and menstrual cycle: Secondary | ICD-10-CM

## 2021-05-07 LAB — COMPREHENSIVE METABOLIC PANEL
ALT: 17 U/L (ref 0–44)
AST: 22 U/L (ref 15–41)
Albumin: 4 g/dL (ref 3.5–5.0)
Alkaline Phosphatase: 73 U/L (ref 38–126)
Anion gap: 6 (ref 5–15)
BUN: 8 mg/dL (ref 6–20)
CO2: 28 mmol/L (ref 22–32)
Calcium: 8.9 mg/dL (ref 8.9–10.3)
Chloride: 104 mmol/L (ref 98–111)
Creatinine, Ser: 0.42 mg/dL — ABNORMAL LOW (ref 0.44–1.00)
GFR, Estimated: >60 mL/min (ref >60)
Glucose, Bld: 84 mg/dL (ref 70–99)
Potassium: 4.5 mmol/L (ref 3.5–5.1)
Sodium: 138 mmol/L (ref 135–145)
Total Bilirubin: 0.3 mg/dL (ref 0.3–1.2)
Total Protein: 8.1 g/dL (ref 6.5–8.1)

## 2021-05-07 LAB — CBC
HCT: 34.5 % — ABNORMAL LOW (ref 36.0–46.0)
Hemoglobin: 10.1 g/dL — ABNORMAL LOW (ref 12.0–15.0)
MCH: 25.4 pg — ABNORMAL LOW (ref 26.0–34.0)
MCHC: 29.3 g/dL — ABNORMAL LOW (ref 30.0–36.0)
MCV: 86.7 fL (ref 80.0–100.0)
Platelets: 239 10*3/uL (ref 150–400)
RBC: 3.98 MIL/uL (ref 3.87–5.11)
RDW: 31 % — ABNORMAL HIGH (ref 11.5–15.5)
WBC: 4.3 10*3/uL (ref 4.0–10.5)
nRBC: 0 % (ref 0.0–0.2)

## 2021-05-07 LAB — APTT: aPTT: 27 seconds (ref 24–36)

## 2021-05-07 LAB — TYPE AND SCREEN
ABO/RH(D): A POS
Antibody Screen: NEGATIVE

## 2021-05-07 LAB — PROTIME-INR
INR: 1 (ref 0.8–1.2)
Prothrombin Time: 13.5 seconds (ref 11.4–15.2)

## 2021-05-07 NOTE — Progress Notes (Signed)
Anesthesia Chart Review:   Case: [id number] Date/Time: 05/20/21 1145   Procedures:      XI ROBOTIC ASSISTED SALPINGECTOMY, ENDOMETRIAL BIOPSY (Bilateral)     XI ROBOTIC ASSISTED UNILATERAL OOPHORECTOMY     XI ROBOTIC ASSISTED TOTAL HYSTERECTOMY; POSSIBLE CONTRALATERAL OOPHORECTOMY, POSSIBLE LAPAROTOMY AND STAGING     HEMORRHOIDECTOMY WITH LIGATION AND HEMORRHOIDOPEXY     RECTAL EXAM UNDER ANESTHESIA   Anesthesia type: General   Pre-op diagnosis: COMPLEX OVARIAN MASS, HEMORRHOIDS PROLAPSING GRADE 3 WITH BLEEDING   Location: [facility] ROOM 05 / [facility]   Surgeons: Lafonda Mosses, MD; Michael Boston, MD       DISCUSSION: Pt is 56 years old with hx anemia, alcohol abuse, hyperthyroidism (s/p radioactive iodine, now hypothyroid).   Pt reports hx of heart murmur. I listened to pt during pre-surgical testing appointment; no murmur heard. Cardiology note 04/28/21 also documents "no murmur"  VS: BP (!) 156/81    Pulse 79    Temp 37.1 C (Oral)    Resp 20    Ht 5\' 1"  (1.549 m)    Wt 67 kg    LMP 04/19/2021 (Approximate)    SpO2 100%    BMI 27.93 kg/m   PROVIDERS: - PCP is Reynold Bowen, MD - Saw cardiologist is Oswaldo Milian, MD for pre-op eval 04/28/21; cleared for surgery without further work up needed.    LABS: Labs reviewed: Acceptable for surgery. (all labs ordered are listed, but only abnormal results are displayed)  Labs Reviewed  CBC - Abnormal; Notable for the following components:      Result Value   Hemoglobin 10.1 (*)    HCT 34.5 (*)    MCH 25.4 (*)    MCHC 29.3 (*)    RDW 31.0 (*)    All other components within normal limits  COMPREHENSIVE METABOLIC PANEL - Abnormal; Notable for the following components:   Creatinine, Ser 0.42 (*)    All other components within normal limits  APTT  PROTIME-INR  TYPE AND SCREEN     IMAGES: CXR 02/16/21: No active cardiopulmonary disease.   EKG 04/28/21: NSR   CV:  N/A  Past Medical History:  Diagnosis Date   Alcohol abuse     Anemia    Anxiety    Blood transfusion without reported diagnosis    Depression    Heart murmur    Heard in late 20s but not heard since   Hyperlipidemia    Hyperthyroidism    s/p radioactive iodine therapy. resolved.   Secondary hypothyroidism     Past Surgical History:  Procedure Laterality Date   BIOPSY  08/08/2020   Procedure: BIOPSY;  Surgeon: Thornton Park, MD;  Location: [facility];  Service: Endoscopy;;   COLONOSCOPY WITH PROPOFOL N/A 08/08/2020   Procedure: COLONOSCOPY WITH PROPOFOL;  Surgeon: Thornton Park, MD;  Location[facility];  Service: Endoscopy;  Laterality: N/A;   ECTOPIC PREGNANCY SURGERY     x2, one with salpingectomy, other with salpingostomy   ESOPHAGOGASTRODUODENOSCOPY (EGD) WITH PROPOFOL N/A 08/08/2020   Procedure: ESOPHAGOGASTRODUODENOSCOPY (EGD) WITH PROPOFOL;  Surgeon: Thornton Park, MD;  [facility];  Service: Endoscopy;  Laterality: N/A;   LAPAROSCOPIC APPENDECTOMY N/A 02/12/2013   Procedure: APPENDECTOMY LAPAROSCOPIC;  Surgeon: Gwenyth Ober, MD;  Location: [facility];  Service: General;  Laterality: N/A;   TUBAL LIGATION     WISDOM TOOTH EXTRACTION      MEDICATIONS:  DULoxetine (CYMBALTA) 60 MG capsule   escitalopram (LEXAPRO) 10 MG tablet   folic acid (  FOLVITE) 800 MCG tablet   ibuprofen (ADVIL) 800 MG tablet   levothyroxine (SYNTHROID) 125 MCG tablet   levothyroxine (SYNTHROID) 175 MCG tablet   REPATHA SURECLICK 300 MG/ML SOAJ   rosuvastatin (CRESTOR) 20 MG tablet   senna-docusate (SENOKOT-S) 8.6-50 MG tablet   traMADol (ULTRAM) 50 MG tablet   vitamin B-12 (CYANOCOBALAMIN) 1000 MCG tablet   No current facility-administered medications for this encounter.    If no changes, I anticipate pt can proceed with surgery as scheduled.   Willeen Cass, PhD, FNP-BC [facility] Short Stay Surgical Center/Anesthesiology Phone: [phone] 05/07/2021 11:58 AM

## 2021-05-07 NOTE — Telephone Encounter (Signed)
Spoke with Ms. Jill Olsen this afternoon and reviewed her lab results from 05/07/21. Per [name], NP pre-op labs look stable. Hemoglobin and other blood labs have improved. Patient verbalized understanding. No questions voiced. Instructed to call with any needs.

## 2021-05-07 NOTE — Anesthesia Preprocedure Evaluation (Addendum)
Anesthesia Evaluation  Patient identified by MRN, date of birth, ID band Patient awake    Reviewed: Allergy & Precautions, NPO status , Patient's Chart, lab work & pertinent test results  Airway Mallampati: I  TM Distance: >3 FB Neck ROM: Full    Dental no notable dental hx. (+) Teeth Intact, Dental Advisory Given   Pulmonary neg pulmonary ROS, former smoker,    Pulmonary exam normal breath sounds clear to auscultation       Cardiovascular negative cardio ROS Normal cardiovascular exam Rhythm:Regular Rate:Normal     Neuro/Psych PSYCHIATRIC DISORDERS Anxiety Depression negative neurological ROS     GI/Hepatic negative GI ROS, (+)     substance abuse  alcohol use,   Endo/Other  Hypothyroidism   Renal/GU negative Renal ROS  negative genitourinary   Musculoskeletal negative musculoskeletal ROS (+)   Abdominal   Peds  Hematology negative hematology ROS (+)   Anesthesia Other Findings   Reproductive/Obstetrics                           Anesthesia Physical Anesthesia Plan  ASA: 2  Anesthesia Plan: General   Post-op Pain Management: Tylenol PO (pre-op)*, Ketamine IV* and Lidocaine infusion*   Induction: Intravenous  PONV Risk Score and Plan: 3 and Midazolam, Dexamethasone and Ondansetron  Airway Management Planned: Oral ETT  Additional Equipment:   Intra-op Plan:   Post-operative Plan: Extubation in OR  Informed Consent: I have reviewed the patients History and Physical, chart, labs and discussed the procedure including the risks, benefits and alternatives for the proposed anesthesia with the patient or authorized representative who has indicated his/her understanding and acceptance.     Dental advisory given  Plan Discussed with: CRNA  Anesthesia Plan Comments: (2 IVs)       Anesthesia Quick Evaluation

## 2021-05-12 ENCOUNTER — Telehealth: Payer: Self-pay | Admitting: *Deleted

## 2021-05-12 NOTE — Telephone Encounter (Signed)
Spoke with pt this afternoon regarding pain control post op. Pt has never taken Oxycodone and is not comfortable taking it if it is in the codeine family. She is agreeable to take Tylenol q 6hr and Tramadol q 12hr for pain control. Pt verbalized understanding.

## 2021-05-19 ENCOUNTER — Telehealth: Payer: Self-pay

## 2021-05-19 ENCOUNTER — Telehealth: Payer: Self-pay | Admitting: Obstetrics and Gynecology

## 2021-05-19 NOTE — Telephone Encounter (Signed)
Mychart message sent.

## 2021-05-19 NOTE — Telephone Encounter (Signed)
Telephone call to check on pre-operative status.  Patient compliant with pre-operative instructions.  Reinforced nothing to eat after midnight. Clear liquids until 0830. Patient to arrive at 0915.  No questions or concerns voiced.  Instructed to call for any needs. 

## 2021-05-20 ENCOUNTER — Other Ambulatory Visit: Payer: Self-pay

## 2021-05-20 ENCOUNTER — Ambulatory Visit (HOSPITAL_COMMUNITY): Payer: 59 | Admitting: Emergency Medicine

## 2021-05-20 ENCOUNTER — Encounter (HOSPITAL_COMMUNITY): Payer: Self-pay | Admitting: Gynecologic Oncology

## 2021-05-20 ENCOUNTER — Ambulatory Visit (HOSPITAL_BASED_OUTPATIENT_CLINIC_OR_DEPARTMENT_OTHER): Payer: 59 | Admitting: Certified Registered Nurse Anesthetist

## 2021-05-20 ENCOUNTER — Ambulatory Visit (HOSPITAL_COMMUNITY)
Admission: RE | Admit: 2021-05-20 | Discharge: 2021-05-20 | Disposition: A | Discharge status: Home / Self Care | Payer: 59 | Source: Ambulatory Visit | Attending: Gynecologic Oncology | Admitting: Gynecologic Oncology

## 2021-05-20 ENCOUNTER — Encounter (HOSPITAL_COMMUNITY)
Admission: RE | Disposition: A | Discharge status: Home / Self Care | Payer: Self-pay | Source: Ambulatory Visit | Attending: Gynecologic Oncology

## 2021-05-20 DIAGNOSIS — Z9049 Acquired absence of other specified parts of digestive tract: Secondary | ICD-10-CM | POA: Diagnosis not present

## 2021-05-20 DIAGNOSIS — Z87891 Personal history of nicotine dependence: Secondary | ICD-10-CM | POA: Insufficient documentation

## 2021-05-20 DIAGNOSIS — R0609 Other forms of dyspnea: Secondary | ICD-10-CM | POA: Diagnosis not present

## 2021-05-20 DIAGNOSIS — D500 Iron deficiency anemia secondary to blood loss (chronic): Secondary | ICD-10-CM | POA: Diagnosis not present

## 2021-05-20 DIAGNOSIS — Z9079 Acquired absence of other genital organ(s): Secondary | ICD-10-CM | POA: Insufficient documentation

## 2021-05-20 DIAGNOSIS — N736 Female pelvic peritoneal adhesions (postinfective): Secondary | ICD-10-CM | POA: Insufficient documentation

## 2021-05-20 DIAGNOSIS — K644 Residual hemorrhoidal skin tags: Secondary | ICD-10-CM | POA: Insufficient documentation

## 2021-05-20 DIAGNOSIS — K625 Hemorrhage of anus and rectum: Secondary | ICD-10-CM | POA: Diagnosis not present

## 2021-05-20 DIAGNOSIS — K642 Third degree hemorrhoids: Secondary | ICD-10-CM | POA: Insufficient documentation

## 2021-05-20 DIAGNOSIS — Z8759 Personal history of other complications of pregnancy, childbirth and the puerperium: Secondary | ICD-10-CM | POA: Diagnosis not present

## 2021-05-20 DIAGNOSIS — N8312 Corpus luteum cyst of left ovary: Secondary | ICD-10-CM | POA: Insufficient documentation

## 2021-05-20 DIAGNOSIS — D261 Other benign neoplasm of corpus uteri: Secondary | ICD-10-CM | POA: Diagnosis not present

## 2021-05-20 DIAGNOSIS — D271 Benign neoplasm of left ovary: Secondary | ICD-10-CM

## 2021-05-20 DIAGNOSIS — F419 Anxiety disorder, unspecified: Secondary | ICD-10-CM | POA: Diagnosis not present

## 2021-05-20 DIAGNOSIS — F32A Depression, unspecified: Secondary | ICD-10-CM | POA: Diagnosis not present

## 2021-05-20 DIAGNOSIS — E039 Hypothyroidism, unspecified: Secondary | ICD-10-CM | POA: Insufficient documentation

## 2021-05-20 DIAGNOSIS — Z8719 Personal history of other diseases of the digestive system: Secondary | ICD-10-CM | POA: Diagnosis not present

## 2021-05-20 DIAGNOSIS — Z803 Family history of malignant neoplasm of breast: Secondary | ICD-10-CM | POA: Diagnosis not present

## 2021-05-20 DIAGNOSIS — Z8041 Family history of malignant neoplasm of ovary: Secondary | ICD-10-CM | POA: Insufficient documentation

## 2021-05-20 DIAGNOSIS — Z8249 Family history of ischemic heart disease and other diseases of the circulatory system: Secondary | ICD-10-CM | POA: Diagnosis not present

## 2021-05-20 DIAGNOSIS — N920 Excessive and frequent menstruation with regular cycle: Secondary | ICD-10-CM | POA: Insufficient documentation

## 2021-05-20 DIAGNOSIS — N9489 Other specified conditions associated with female genital organs and menstrual cycle: Secondary | ICD-10-CM

## 2021-05-20 HISTORY — PX: XI ROBOTIC ASSISTED OOPHORECTOMY: SHX6823

## 2021-05-20 HISTORY — PX: RECTAL EXAM UNDER ANESTHESIA: SHX6399

## 2021-05-20 HISTORY — PX: XI ROBOTIC ASSISTED SALPINGECTOMY: SHX6824

## 2021-05-20 HISTORY — PX: HEMORRHOID SURGERY: SHX153

## 2021-05-20 LAB — PREGNANCY, URINE: Preg Test, Ur: NEGATIVE

## 2021-05-20 SURGERY — SALPINGECTOMY, ROBOT-ASSISTED
Anesthesia: General | Site: Rectum

## 2021-05-20 MED ORDER — FENTANYL CITRATE (PF) 100 MCG/2ML IJ SOLN
INTRAMUSCULAR | Status: AC
  Filled 2021-05-20: qty 2

## 2021-05-20 MED ORDER — BUPIVACAINE LIPOSOME 1.3 % IJ SUSP: 20.0000 mL | Freq: Once | INTRAMUSCULAR | Status: DC

## 2021-05-20 MED ORDER — OXYCODONE HCL 5 MG PO TABS
ORAL_TABLET | ORAL | Status: AC
  Filled 2021-05-20: qty 1

## 2021-05-20 MED ORDER — MIDAZOLAM HCL 2 MG/2ML IJ SOLN
INTRAMUSCULAR | Status: AC
  Filled 2021-05-20: qty 2

## 2021-05-20 MED ORDER — FENTANYL CITRATE (PF) 100 MCG/2ML IJ SOLN
INTRAMUSCULAR | Status: DC | PRN
  Administered 2021-05-20: 50 ug via INTRAVENOUS

## 2021-05-20 MED ORDER — FENTANYL CITRATE PF 50 MCG/ML IJ SOSY
PREFILLED_SYRINGE | INTRAMUSCULAR | Status: AC
  Filled 2021-05-20: qty 1

## 2021-05-20 MED ORDER — DEXAMETHASONE SODIUM PHOSPHATE 10 MG/ML IJ SOLN
INTRAMUSCULAR | Status: DC | PRN
  Administered 2021-05-20: 8 mg via INTRAVENOUS

## 2021-05-20 MED ORDER — LIDOCAINE 2% (20 MG/ML) 5 ML SYRINGE
INTRAMUSCULAR | Status: DC | PRN
  Administered 2021-05-20: 40 mg via INTRAVENOUS

## 2021-05-20 MED ORDER — FENTANYL CITRATE PF 50 MCG/ML IJ SOSY
PREFILLED_SYRINGE | INTRAMUSCULAR | Status: AC
  Administered 2021-05-20: 50 ug via INTRAVENOUS
  Filled 2021-05-20: qty 1

## 2021-05-20 MED ORDER — LIDOCAINE HCL (PF) 2 % IJ SOLN
INTRAMUSCULAR | Status: DC | PRN
Start: 2021-05-20 — End: 2021-05-20
  Administered 2021-05-20: 1.5 mg/kg/h via INTRADERMAL

## 2021-05-20 MED ORDER — DIBUCAINE (PERIANAL) 1 % EX OINT
TOPICAL_OINTMENT | CUTANEOUS | Status: AC
  Filled 2021-05-20: qty 28

## 2021-05-20 MED ORDER — DIAZEPAM 5 MG PO TABS: 5.0000 mg | ORAL_TABLET | Freq: Three times a day (TID) | ORAL | 1 refills | Status: DC | PRN

## 2021-05-20 MED ORDER — ROCURONIUM BROMIDE 10 MG/ML (PF) SYRINGE
PREFILLED_SYRINGE | INTRAVENOUS | Status: AC
  Filled 2021-05-20: qty 10

## 2021-05-20 MED ORDER — LACTATED RINGERS IV SOLN: INTRAVENOUS | Status: DC | PRN

## 2021-05-20 MED ORDER — ROCURONIUM BROMIDE 10 MG/ML (PF) SYRINGE
PREFILLED_SYRINGE | INTRAVENOUS | Status: DC | PRN
  Administered 2021-05-20: 60 mg via INTRAVENOUS
  Administered 2021-05-20: 20 mg via INTRAVENOUS

## 2021-05-20 MED ORDER — CHLORHEXIDINE GLUCONATE 0.12 % MT SOLN
15.0000 mL | Freq: Once | OROMUCOSAL | Status: AC
  Administered 2021-05-20: 15 mL via OROMUCOSAL

## 2021-05-20 MED ORDER — DEXMEDETOMIDINE (PRECEDEX) IN NS 20 MCG/5ML (4 MCG/ML) IV SYRINGE
PREFILLED_SYRINGE | INTRAVENOUS | Status: DC | PRN
Start: 2021-05-20 — End: 2021-05-20
  Administered 2021-05-20: 12 ug via INTRAVENOUS

## 2021-05-20 MED ORDER — GABAPENTIN 300 MG PO CAPS
300.0000 mg | ORAL_CAPSULE | ORAL | Status: AC
  Administered 2021-05-20: 300 mg via ORAL
  Filled 2021-05-20: qty 1

## 2021-05-20 MED ORDER — LACTATED RINGERS IV SOLN: INTRAVENOUS | Status: DC

## 2021-05-20 MED ORDER — ONDANSETRON HCL 4 MG/2ML IJ SOLN
INTRAMUSCULAR | Status: DC | PRN
  Administered 2021-05-20: 4 mg via INTRAVENOUS

## 2021-05-20 MED ORDER — MIDAZOLAM HCL 5 MG/5ML IJ SOLN
INTRAMUSCULAR | Status: DC | PRN
Start: 2021-05-20 — End: 2021-05-20
  Administered 2021-05-20: 2 mg via INTRAVENOUS

## 2021-05-20 MED ORDER — DEXAMETHASONE SODIUM PHOSPHATE 10 MG/ML IJ SOLN
INTRAMUSCULAR | Status: AC
  Filled 2021-05-20: qty 1

## 2021-05-20 MED ORDER — DIBUCAINE (PERIANAL) 1 % EX OINT
TOPICAL_OINTMENT | CUTANEOUS | Status: DC | PRN
  Administered 2021-05-20: 1 via RECTAL

## 2021-05-20 MED ORDER — PROPOFOL 10 MG/ML IV BOLUS
INTRAVENOUS | Status: DC | PRN
  Administered 2021-05-20: 50 mg via INTRAVENOUS
  Administered 2021-05-20: 150 mg via INTRAVENOUS

## 2021-05-20 MED ORDER — ORAL CARE MOUTH RINSE: 15.0000 mL | Freq: Once | OROMUCOSAL | Status: AC

## 2021-05-20 MED ORDER — OXYCODONE HCL 5 MG PO TABS
5.0000 mg | ORAL_TABLET | Freq: Once | ORAL | Status: AC
  Administered 2021-05-20: 5 mg via ORAL

## 2021-05-20 MED ORDER — LIDOCAINE HCL (PF) 2 % IJ SOLN
INTRAMUSCULAR | Status: AC
  Filled 2021-05-20: qty 25

## 2021-05-20 MED ORDER — CHLORHEXIDINE GLUCONATE CLOTH 2 % EX PADS: 6.0000 | MEDICATED_PAD | Freq: Once | CUTANEOUS | Status: DC

## 2021-05-20 MED ORDER — KETAMINE HCL 50 MG/5ML IJ SOSY
PREFILLED_SYRINGE | INTRAMUSCULAR | Status: AC
  Filled 2021-05-20: qty 5

## 2021-05-20 MED ORDER — BUPIVACAINE HCL 0.25 % IJ SOLN
INTRAMUSCULAR | Status: DC | PRN
  Administered 2021-05-20: 30 mL

## 2021-05-20 MED ORDER — BUPIVACAINE LIPOSOME 1.3 % IJ SUSP
INTRAMUSCULAR | Status: DC | PRN
  Administered 2021-05-20: 20 mL

## 2021-05-20 MED ORDER — BUPIVACAINE HCL (PF) 0.25 % IJ SOLN
INTRAMUSCULAR | Status: AC
  Filled 2021-05-20: qty 30

## 2021-05-20 MED ORDER — METHYLENE BLUE 0.5 % INJ SOLN
INTRAVENOUS | Status: AC
  Filled 2021-05-20: qty 10

## 2021-05-20 MED ORDER — ACETAMINOPHEN 500 MG PO TABS
1000.0000 mg | ORAL_TABLET | ORAL | Status: AC
  Administered 2021-05-20: 1000 mg via ORAL
  Filled 2021-05-20: qty 2

## 2021-05-20 MED ORDER — FENTANYL CITRATE PF 50 MCG/ML IJ SOSY
25.0000 ug | PREFILLED_SYRINGE | INTRAMUSCULAR | Status: DC | PRN
  Administered 2021-05-20: 50 ug via INTRAVENOUS

## 2021-05-20 MED ORDER — PROPOFOL 10 MG/ML IV BOLUS
INTRAVENOUS | Status: AC
Start: 2021-05-20 — End: ?
  Filled 2021-05-20: qty 20

## 2021-05-20 MED ORDER — LACTATED RINGERS IR SOLN
Status: DC | PRN
  Administered 2021-05-20: 1000 mL

## 2021-05-20 MED ORDER — BUPIVACAINE-EPINEPHRINE 0.5% -1:200000 IJ SOLN
INTRAMUSCULAR | Status: DC | PRN
  Administered 2021-05-20: 20 mL

## 2021-05-20 MED ORDER — HEPARIN SODIUM (PORCINE) 5000 UNIT/ML IJ SOLN
5000.0000 [IU] | INTRAMUSCULAR | Status: AC
  Administered 2021-05-20: 5000 [IU] via SUBCUTANEOUS
  Filled 2021-05-20: qty 1

## 2021-05-20 MED ORDER — BUPIVACAINE LIPOSOME 1.3 % IJ SUSP
INTRAMUSCULAR | Status: AC
  Filled 2021-05-20: qty 20

## 2021-05-20 MED ORDER — SUGAMMADEX SODIUM 200 MG/2ML IV SOLN
INTRAVENOUS | Status: DC | PRN
  Administered 2021-05-20: 200 mg via INTRAVENOUS

## 2021-05-20 MED ORDER — KETAMINE HCL 10 MG/ML IJ SOLN
INTRAMUSCULAR | Status: DC | PRN
  Administered 2021-05-20: 15 mg via INTRAVENOUS
  Administered 2021-05-20: 25 mg via INTRAVENOUS

## 2021-05-20 MED ORDER — OXYCODONE HCL 5 MG PO TABS
5.0000 mg | ORAL_TABLET | Freq: Four times a day (QID) | ORAL | 0 refills | Status: DC | PRN
Start: 2021-05-20 — End: 2023-12-24

## 2021-05-20 MED ORDER — ONDANSETRON HCL 4 MG/2ML IJ SOLN
INTRAMUSCULAR | Status: AC
  Filled 2021-05-20: qty 2

## 2021-05-20 MED ORDER — CELECOXIB 200 MG PO CAPS
200.0000 mg | ORAL_CAPSULE | ORAL | Status: AC
  Administered 2021-05-20: 200 mg via ORAL
  Filled 2021-05-20: qty 1

## 2021-05-20 MED ORDER — BUPIVACAINE-EPINEPHRINE (PF) 0.5% -1:200000 IJ SOLN
INTRAMUSCULAR | Status: AC
  Filled 2021-05-20: qty 60

## 2021-05-20 MED ORDER — ENSURE PRE-SURGERY PO LIQD: 296.0000 mL | Freq: Once | ORAL | Status: DC

## 2021-05-20 MED ORDER — SODIUM CHLORIDE 0.9 % IV SOLN
2.0000 g | INTRAVENOUS | Status: AC
  Administered 2021-05-20: 2 g via INTRAVENOUS
  Filled 2021-05-20: qty 20

## 2021-05-20 MED ORDER — DIPHENHYDRAMINE HCL 50 MG/ML IJ SOLN
INTRAMUSCULAR | Status: DC | PRN
  Administered 2021-05-20: 12.5 mg via INTRAVENOUS

## 2021-05-20 MED ORDER — STERILE WATER FOR IRRIGATION IR SOLN
Status: DC | PRN
  Administered 2021-05-20: 1000 mL

## 2021-05-20 SURGICAL SUPPLY — 103 items
ADH SKN CLS APL DERMABOND .7 (GAUZE/BANDAGES/DRESSINGS) ×3
AGENT HMST KT MTR STRL THRMB (HEMOSTASIS)
APL ESCP 34 STRL LF DISP (HEMOSTASIS)
APL SKNCLS STERI-STRIP NONHPOA (GAUZE/BANDAGES/DRESSINGS) ×3
APPLICATOR SURGIFLO ENDO (HEMOSTASIS) IMPLANT
BACTOSHIELD CHG 4% 4OZ (MISCELLANEOUS) ×1
BAG COUNTER SPONGE SURGICOUNT (BAG) IMPLANT
BAG LAPAROSCOPIC 12 15 PORT 16 (BASKET) IMPLANT
BAG RETRIEVAL 12/15 (BASKET)
BAG SPEC RTRVL LRG 6X4 10 (ENDOMECHANICALS)
BAG SPNG CNTER NS LX DISP (BAG)
BENZOIN TINCTURE PRP APPL 2/3 (GAUZE/BANDAGES/DRESSINGS) ×4 IMPLANT
BLADE SURG 15 STRL LF DISP TIS (BLADE) IMPLANT
BLADE SURG 15 STRL SS (BLADE)
BLADE SURG SZ10 CARB STEEL (BLADE) IMPLANT
CNTNR URN SCR LID CUP LEK RST (MISCELLANEOUS) ×3 IMPLANT
CONT SPEC 4OZ STRL OR WHT (MISCELLANEOUS) ×4
COVER BACK TABLE 60X90IN (DRAPES) ×4 IMPLANT
COVER SURGICAL LIGHT HANDLE (MISCELLANEOUS) ×4 IMPLANT
COVER TIP SHEARS 8 DVNC (MISCELLANEOUS) ×3 IMPLANT
COVER TIP SHEARS 8MM DA VINCI (MISCELLANEOUS) ×4
DERMABOND ADVANCED (GAUZE/BANDAGES/DRESSINGS) ×1
DERMABOND ADVANCED .7 DNX12 (GAUZE/BANDAGES/DRESSINGS) ×3 IMPLANT
DRAPE ARM DVNC X/XI (DISPOSABLE) ×12 IMPLANT
DRAPE COLUMN DVNC XI (DISPOSABLE) ×3 IMPLANT
DRAPE DA VINCI XI ARM (DISPOSABLE) ×16
DRAPE DA VINCI XI COLUMN (DISPOSABLE) ×4
DRAPE LAPAROTOMY T 102X78X121 (DRAPES) ×4 IMPLANT
DRAPE SHEET LG 3/4 BI-LAMINATE (DRAPES) ×4 IMPLANT
DRAPE SURG IRRIG POUCH 19X23 (DRAPES) ×4 IMPLANT
DRSG OPSITE POSTOP 4X6 (GAUZE/BANDAGES/DRESSINGS) IMPLANT
DRSG OPSITE POSTOP 4X8 (GAUZE/BANDAGES/DRESSINGS) IMPLANT
DRSG PAD ABDOMINAL 8X10 ST (GAUZE/BANDAGES/DRESSINGS) IMPLANT
ELECT PENCIL ROCKER SW 15FT (MISCELLANEOUS) IMPLANT
ELECT REM PT RETURN 15FT ADLT (MISCELLANEOUS) ×8 IMPLANT
GAUZE 4X4 16PLY ~~LOC~~+RFID DBL (SPONGE) ×8 IMPLANT
GAUZE SPONGE 4X4 12PLY STRL (GAUZE/BANDAGES/DRESSINGS) IMPLANT
GLOVE SURG ENC MOIS LTX SZ6 (GLOVE) ×16 IMPLANT
GLOVE SURG ENC MOIS LTX SZ6.5 (GLOVE) ×8 IMPLANT
GLOVE SURG NEOPR MICRO LF SZ8 (GLOVE) ×4 IMPLANT
GLOVE SURG UNDER LTX SZ8 (GLOVE) ×4 IMPLANT
GOWN STRL REUS W/ TWL LRG LVL3 (GOWN DISPOSABLE) ×12 IMPLANT
GOWN STRL REUS W/TWL LRG LVL3 (GOWN DISPOSABLE) ×16
GOWN STRL REUS W/TWL XL LVL3 (GOWN DISPOSABLE) ×8 IMPLANT
HOLDER FOLEY CATH W/STRAP (MISCELLANEOUS) IMPLANT
IRRIG SUCT STRYKERFLOW 2 WTIP (MISCELLANEOUS) ×4
IRRIGATION SUCT STRKRFLW 2 WTP (MISCELLANEOUS) ×3 IMPLANT
KIT BASIN OR (CUSTOM PROCEDURE TRAY) ×4 IMPLANT
KIT PROCEDURE DA VINCI SI (MISCELLANEOUS)
KIT PROCEDURE DVNC SI (MISCELLANEOUS) IMPLANT
KIT TURNOVER KIT A (KITS) IMPLANT
LOOP VESSEL MAXI BLUE (MISCELLANEOUS) IMPLANT
MANIPULATOR ADVINCU DEL 3.0 PL (MISCELLANEOUS) IMPLANT
MANIPULATOR ADVINCU DEL 3.5 PL (MISCELLANEOUS) IMPLANT
MANIPULATOR UTERINE 4.5 ZUMI (MISCELLANEOUS) IMPLANT
NDL HYPO 21X1.5 SAFETY (NEEDLE) ×3 IMPLANT
NDL SPNL 18GX3.5 QUINCKE PK (NEEDLE) IMPLANT
NEEDLE HYPO 21X1.5 SAFETY (NEEDLE) ×4 IMPLANT
NEEDLE HYPO 22GX1.5 SAFETY (NEEDLE) ×4 IMPLANT
NEEDLE SPNL 18GX3.5 QUINCKE PK (NEEDLE) IMPLANT
OBTURATOR OPTICAL STANDARD 8MM (TROCAR) ×4
OBTURATOR OPTICAL STND 8 DVNC (TROCAR) ×3
OBTURATOR OPTICALSTD 8 DVNC (TROCAR) ×3 IMPLANT
PACK BASIC VI WITH GOWN DISP (CUSTOM PROCEDURE TRAY) ×4 IMPLANT
PACK ROBOT GYN CUSTOM WL (TRAY / TRAY PROCEDURE) ×4 IMPLANT
PAD POSITIONING PINK XL (MISCELLANEOUS) ×4 IMPLANT
PANTS MESH DISP LRG (UNDERPADS AND DIAPERS) ×3 IMPLANT
PANTS MESH DISPOSABLE L (UNDERPADS AND DIAPERS) ×1
PENCIL SMOKE EVACUATOR (MISCELLANEOUS) IMPLANT
PORT ACCESS TROCAR AIRSEAL 12 (TROCAR) ×3 IMPLANT
PORT ACCESS TROCAR AIRSEAL 5M (TROCAR) ×1
POUCH SPECIMEN RETRIEVAL 10MM (ENDOMECHANICALS) IMPLANT
SCRUB CHG 4% DYNA-HEX 4OZ (MISCELLANEOUS) ×3 IMPLANT
SEAL CANN UNIV 5-8 DVNC XI (MISCELLANEOUS) ×12 IMPLANT
SEAL XI 5MM-8MM UNIVERSAL (MISCELLANEOUS) ×16
SET TRI-LUMEN FLTR TB AIRSEAL (TUBING) ×4 IMPLANT
SHEARS HARMONIC 9CM CVD (BLADE) IMPLANT
SPIKE FLUID TRANSFER (MISCELLANEOUS) ×8 IMPLANT
SPONGE T-LAP 18X18 ~~LOC~~+RFID (SPONGE) IMPLANT
SURGIFLO W/THROMBIN 8M KIT (HEMOSTASIS) IMPLANT
SURGILUBE 2OZ TUBE FLIPTOP (MISCELLANEOUS) ×4 IMPLANT
SUT CHROMIC 2 0 SH (SUTURE) ×5 IMPLANT
SUT CHROMIC 3 0 SH 27 (SUTURE) IMPLANT
SUT MNCRL AB 4-0 PS2 18 (SUTURE) IMPLANT
SUT PDS AB 1 TP1 96 (SUTURE) IMPLANT
SUT VIC AB 0 CT1 27 (SUTURE)
SUT VIC AB 0 CT1 27XBRD ANTBC (SUTURE) IMPLANT
SUT VIC AB 2-0 CT1 27 (SUTURE)
SUT VIC AB 2-0 CT1 TAPERPNT 27 (SUTURE) IMPLANT
SUT VIC AB 2-0 SH 27 (SUTURE)
SUT VIC AB 2-0 SH 27X BRD (SUTURE) IMPLANT
SUT VIC AB 2-0 UR6 27 (SUTURE) ×24 IMPLANT
SUT VIC AB 4-0 PS2 18 (SUTURE) ×8 IMPLANT
SYR 10ML LL (SYRINGE) IMPLANT
SYR 20ML LL LF (SYRINGE) ×4 IMPLANT
SYR 3ML LL SCALE MARK (SYRINGE) IMPLANT
TOWEL OR NON WOVEN STRL DISP B (DISPOSABLE) ×4 IMPLANT
TRAP SPECIMEN MUCUS 40CC (MISCELLANEOUS) IMPLANT
TRAY FOLEY MTR SLVR 16FR STAT (SET/KITS/TRAYS/PACK) ×4 IMPLANT
TROCAR XCEL NON-BLD 5MMX100MML (ENDOMECHANICALS) IMPLANT
UNDERPAD 30X36 HEAVY ABSORB (UNDERPADS AND DIAPERS) ×8 IMPLANT
WATER STERILE IRR 1000ML POUR (IV SOLUTION) ×4 IMPLANT
YANKAUER SUCT BULB TIP 10FT TU (MISCELLANEOUS) IMPLANT

## 2021-05-20 NOTE — Transfer of Care (Signed)
Immediate Anesthesia Transfer of Care Note ? ?Patient: Jill Olsen ? ?Procedure(s) Performed: XI ROBOTIC ASSISTED SALPINGECTOMY, ENDOMETRIAL BIOPSY (Bilateral) ?XI ROBOTIC ASSISTED UNILATERAL OOPHORECTOMY ?HEMORRHOIDECTOMY WITH LIGATION x 3 AND HEMORRHOIDOPEXY (Rectum) ?RECTAL EXAM UNDER ANESTHESIA ? ?Patient Location: PACU ? ?Anesthesia Type:General ? ?Level of Consciousness: awake ? ?Airway & Oxygen Therapy: Patient Spontanous Breathing and Patient connected to face mask oxygen ? ?Post-op Assessment: Report given to RN and Post -op Vital signs reviewed and stable ? ?Post vital signs: Reviewed and stable . Anesthesiologist notified of BP ? ?Last Vitals:  ?Vitals Value Taken Time  ?BP 190/91 05/20/21 1310  ?Temp    ?Pulse 87 05/20/21 1314  ?Resp 19 05/20/21 1314  ?SpO2 89 % 05/20/21 1314  ?Vitals shown include unvalidated device data. ? ?Last Pain:  ?Vitals:  ? 05/20/21 0936  ?TempSrc: Oral  ?PainSc: 0-No pain  ?   ? ?  ? ?Complications: No notable events documented. ?

## 2021-05-20 NOTE — H&P (Signed)
05/20/2021     REFERRING PHYSICIAN: Salvadore Dom, MD  Patient Care Team: Bonney Aid, MD as PCP - General (Endocrinology) Beavers, Delight Ovens, MD (Gastroenterology) Berline Lopes August Albino, MD (Gynecologic Oncology)  PROVIDER: Hollace Kinnier, MD  [facility] MRN: [id number] DOB: 20-Dec-1965 DATE OF ENCOUNTER: 04/13/2021  SUBJECTIVE   Chief Complaint: Hemorrhoids   History of Present Illness: Jill Olsen is a 56 y.o. female who is seen today  as an office consultation at the request of Dr. Talbert Nan  for evaluation of Hemorrhoids .   Jill Olsen who has had severe rectal bleeding. Intermittent for the past year. Thought was mainly hemorrhoids. Also occasional menorrhagia. Had episode with lightheadedness and persistent bleeding and was admitted in May with a very low hemoglobin. Transfused. Underwent endoscopy. Enlarged irritated hemorrhoids noted. No polyp or cancers. Some erosive gastropathy but no active GI bleeding. No severe gastritis is noted in 2013. Diverticular bleed not seen. I think there was discussion about referral to see surgery for this. She had another episode of bleeding and lightheadedness around Thanksgiving. Sent to see hematology. They have started her on iron infusions. She is getting her third 1 this week. However in the work-up she was found to have adnexal masses. Seen by gynecology and then eventually gynecological oncology. Atypical adnexa. Possible menorrhagia issues as well surgical resection offered. Dr. Radford Pax thought perhaps a combined surgery case would be reasonable. Consultation with colorectal surgery sent again.  Patient comes today with her husband. She notes she used to move her bowels once a day but has been going 3-4 times now. She has not ever had any prior anorectal issues. She notes she gets very short of breath if she walks more than a block or 2. She does not smoke. No cardiac or pulmonary issues that she knows of.  However, her father did have his first heart attack in his 86s around her age. Moderate heavy alcohol use. Apparently she has been trying to back off on that. She had an appendectomy by Dr. Hulen Skains with our group in 2014. Did have evidence of gastritis in 2013 by [facility].  Medical History:  Past Medical History:  Diagnosis Date   Anemia   Anxiety   Hyperlipidemia   Thyroid disease   Patient Active Problem List  Diagnosis   Mass of right ovary   Symptomatic anemia, unspecified   Internal hemorrhoid, bleeding   History reviewed. No pertinent surgical history.   Allergies  Allergen Reactions   Codeine Itching, Other (See Comments) and Rash  Has numbness around mouth Lips numb   Fentanyl Itching   Hydromorphone Hcl Itching   Levothyroxine Unknown  headaches   Current Outpatient Medications on File Prior to Visit  Medication Sig Dispense Refill   cyanocobalamin, vitamin B-12, 2,000 mcg Tab Take by mouth   escitalopram oxalate (LEXAPRO) 10 MG tablet Take 1 tablet by mouth once daily   fluconazole (DIFLUCAN) 150 MG tablet Take 1 tablet by mouth now may repeat in 72 hours if still symptomatic.   folic acid (FOLVITE) 1 MG tablet Take by mouth   levothyroxine (SYNTHROID) 175 MCG tablet Take by mouth   REPATHA SURECLICK 470 mg/mL PnIj   rosuvastatin (CRESTOR) 20 MG tablet   No current facility-administered medications on file prior to visit.   Family History  Problem Relation Age of Onset   High blood pressure (Hypertension) Father   Coronary Artery Disease (Blocked arteries around heart) Father   Skin cancer Sister   Obesity Sister  Breast cancer Sister    Social History   Tobacco Use  Smoking Status Former   Types: Cigarettes   Quit date: 1999   Years since quitting: 24.0  Smokeless Tobacco Not on file    Social History   Socioeconomic History   Marital status: Married  Tobacco Use   Smoking status: Former  Types: Cigarettes  Quit date: 1999  Years since  quitting: 24.0  Substance and Sexual Activity   Alcohol use: Yes   Drug use: Not Currently   ############################################################  Review of Systems: A complete review of systems (ROS) was obtained from the patient. I have reviewed this information and discussed as appropriate with the patient. See HPI as well for other pertinent ROS.  Constitutional: No fevers, chills, sweats. Weight stable Eyes: No vision changes, No discharge HENT: No sore throats, nasal drainage Lymph: No neck swelling, No bruising easily Pulmonary: No cough, productive sputum CV: No orthopnea, PND Patient walks 10 minutes for about 1-2 blocks -then has to stop due to shortness of breath. No exertional chest/neck/shoulder/arm pain. Father had heart attack in his 29s  GI: No personal nor family history of GI/colon cancer, inflammatory bowel disease, irritable bowel syndrome, allergy such as Celiac Sprue, dietary/dairy problems, colitis, ulcers nor gastritis. No recent sick contacts/gastroenteritis. No travel outside the country. No changes in diet.  Renal: No UTIs, No hematuria Genital: No drainage, bleeding, masses Musculoskeletal: No severe joint pain. Good ROM major joints Skin: No sores or lesions Heme/Lymph: No easy bleeding. No swollen lymph nodes  OBJECTIVE   Vitals:  04/13/21 0954  BP: (!) 158/70  Pulse: 52  Weight: 68.5 kg (151 lb)  Height: 152.4 cm (5')    Body mass index is 29.49 kg/m.  PHYSICAL EXAM:  Constitutional: Not cachectic. Hygeine adequate. Vitals signs as above.  Eyes: Pupils reactive, normal extraocular movements. Sclera nonicteric Neuro: CN II-XII intact. No major focal sensory defects. No major motor deficits. Lymph: No head/neck/groin lymphadenopathy Psych: No severe agitation. No severe anxiety. Judgment & insight Adequate, Oriented x4, HENT: Normocephalic, Mucus membranes moist. No thrush. Hearing: adequate Neck: Supple, No tracheal deviation. No  obvious thyromegaly Chest: No pain to chest wall compression. Good respiratory excursion. No audible wheezing CV: Pulses intact. Regular rhythm. No major extremity edema Ext: No obvious deformity or contracture. Edema: Not present. No cyanosis Skin: No major subcutaneous nodules. Warm and dry Musculoskeletal: Severe joint rigidity not present. No obvious clubbing. No digital petechiae. Mobility: no assist device moving easily without restrictions  Abdomen: Flat Soft. Nondistended. Nontender. Hernia: Not present. Diastasis recti: Not present. No hepatomegaly. No splenomegaly.  Genital/Pelvic: Inguinal hernia: Not present. Inguinal lymph nodes: without lymphadenopathy nor hidradenitis.   Rectal: ##################################  Perianal skin Clean with good hygiene  Pruritis ani: Not present Pilonidal disease: Not present Condyloma / warts: Not present  Anal fissure: Not present Perirectal abscess/fistula Not present External hemorrhoids large external hemorrhoids especially right anterior more than left lateral. Redundant external hemorrhoidal tissue overall  Digital and anoscopic rectal exam tolerated  Sphincter tone Normal  Hemorrhoidal piles enlarged irritated hemorrhoid piles right anterior greater than right posterior at least grade 3. Left lateral more grade 2. Prostate: N/A Rectal masses: Not present  Other significant findings: Distal rectovaginal septum thin with mild rectocele.  Patient examined with assistance of female Medical Assistant in the room with patient in decubitus position .  ###################################    ###################################################################  Labs, Imaging and Diagnostic Testing:  Located in [place]' section of Epic EMR chart  PRIOR [facility] CLINIC NOTES:  Located in [place]' section of Epic EMR chart  SURGERY NOTES:  Located in [place]' section of Epic EMR  chart  PATHOLOGY:  Located in [place]' section of Epic EMR chart  Assessment and Plan:  DIAGNOSES:  Diagnoses and all orders for this visit:  Internal hemorrhoid, bleeding  Symptomatic anemia, unspecified  Mass of right ovary  Family history of MI (myocardial infarction)  DOE (dyspnea on exertion)  Moderate alcohol consumption    ASSESSMENT/PLAN  Jill Olsen with symptomatic anemia from rectal bleeding. Prior episode of gastritis 9 years ago but no recurrent ulcer. Prior endoscopy argues against anything more proximal than her enlarged irritated hemorrhoids. Grade 3 prolapsing hemorrhoids with external tags with recurrent bright red bleeding.  I agree with replacing her iron with oral and IV transfusions.  Unilateral ovarian mass to fallopian tubes. I believe right-sided. Seen by gynecological oncology. Aggressive plan for robotic bilateral salpingectomy with unilateral oophorectomy  I think she would benefit from hemorrhoid surgery. Internal hemorrhoidal ligation & pexy and hemorrhoidectomy. Suspect right anterior and left lateral piles will need to be removed as well. This can be done at the end of Dr. Charisse March case in lithotomy by me.  The anatomy & physiology of the anorectal region was discussed. The pathophysiology of hemorrhoids and differential diagnosis was discussed. Natural history risks without surgery was discussed. I stressed the importance of a bowel regimen to have daily soft bowel movements to minimize progression of disease. Interventions such as sclerotherapy & banding were discussed.  The patient's symptoms are not adequately controlled by medicines and other non-operative treatments. I feel the risks & problems of no surgery outweigh the operative risks; therefore, I recommended surgery to treat the hemorrhoids by ligation, pexy, and possible resection.  Risks such as bleeding, infection, urinary difficulties, injury to other organs, need for  repair of tissues / organs, need for further treatment, heart attack, death, and other risks were discussed. I noted a good likelihood this will help address the problem. Goals of post-operative recovery were discussed as well. Possibility that this will not correct all symptoms was explained. Post-operative pain, bleeding, constipation, and other problems after surgery were discussed. We will work to minimize complications. Educational handouts further explaining the pathology, treatment options, and bowel regimen were given as well. Questions were answered. The patient expresses understanding & wishes to proceed with surgery.  Adin Hector, MD, FACS, MASCRS Esophageal, Gastrointestinal & Colorectal Surgery Robotic and Minimally Invasive Surgery  [facility], [place]  [place] [place], [place] [address] [phone] Fax [phone] Main  CONTACT INFORMATION:  Weekday (9AM-5PM): Call [facility] main office at [phone]  Weeknight (5PM-9AM) or Weekend/Holiday: Check [email or web address] (password " [email or web address]") for General Surgery [facility] coverage  (Please, do not use SecureChat as it is not reliable communication to operating surgeons for immediate patient care)    05/20/2021

## 2021-05-20 NOTE — Anesthesia Procedure Notes (Signed)
Procedure Name: Intubation ?Date/Time: 05/20/2021 11:12 AM ?Performed by: Milford Cage, CRNA ?Pre-anesthesia Checklist: Patient identified, Emergency Drugs available, Suction available and Patient being monitored ?Patient Re-evaluated:Patient Re-evaluated prior to induction ?Oxygen Delivery Method: Circle system utilized ?Preoxygenation: Pre-oxygenation with 100% oxygen ?Induction Type: IV induction ?Ventilation: Mask ventilation without difficulty ?Laryngoscope Size: Glidescope and 3 ?Grade View: Grade I ?Tube type: Oral ?Tube size: 7.0 mm ?Number of attempts: 2 ?Airway Equipment and Method: Rigid stylet ?Placement Confirmation: ETT inserted through vocal cords under direct vision, positive ETCO2 and breath sounds checked- equal and bilateral ?Secured at: 20 cm ?Tube secured with: Tape ?Dental Injury: Teeth and Oropharynx as per pre-operative assessment  ?Difficulty Due To: Difficulty was unanticipated and Difficult Airway- due to anterior larynx ?Future Recommendations: Recommend- induction with short-acting agent, and alternative techniques readily available ?Comments: Grade 3 with Mil 2. Grade 1 with Glidescope. Narrow palate, anterior larynx ? ? ? ? ?

## 2021-05-20 NOTE — Interval H&P Note (Signed)
History and Physical Interval Note: ? ?05/20/2021 ?10:20 AM ? ?Jill Olsen  has presented today for surgery, with the diagnosis of COMPLEX OVARIAN MASS, HEMORRHOIDS PROLAPSING GRADE 3 WITH BLEEDING.  The various methods of treatment have been discussed with the patient and family. After consideration of risks, benefits and other options for treatment, the patient has consented to  Procedure(s): ?XI ROBOTIC ASSISTED SALPINGECTOMY, ENDOMETRIAL BIOPSY (Bilateral) ?XI ROBOTIC ASSISTED UNILATERAL OOPHORECTOMY (N/A) ?XI ROBOTIC ASSISTED TOTAL HYSTERECTOMY; POSSIBLE CONTRALATERAL OOPHORECTOMY, POSSIBLE LAPAROTOMY AND STAGING (N/A) ?HEMORRHOIDECTOMY WITH LIGATION AND HEMORRHOIDOPEXY (N/A) ?RECTAL EXAM UNDER ANESTHESIA (N/A) as a surgical intervention.  The patient's history has been reviewed, patient examined, no change in status, stable for surgery.  I have reviewed the patient's chart and labs.  Questions were answered to the patient's satisfaction.   ? ?I have re-reviewed the the patient's records, history, medications, and allergies.  I have re-examined the patient.  I again discussed intraoperative plans and goals of post-operative recovery.  The patient agrees to proceed. ? ?Jill Olsen  ?[birth year] ?[id number] ? ?Patient Care Team: ?Reynold Bowen, MD as PCP - General (Endocrinology) ?Donato Heinz, MD as PCP - Cardiology (Cardiology) ?Michael Boston, MD as Consulting Physician (General Surgery) ?Lafonda Mosses, MD as Consulting Physician (Gynecologic Oncology) ?Milus Banister, MD as Attending Physician (Gastroenterology) ? ?Patient Active Problem List  ? Diagnosis Date Noted  ? Erosive gastropathy 04/13/2021  ? Adnexal mass 04/10/2021  ? Acne vulgaris 03/10/2021  ? Actinic keratosis 03/10/2021  ? Benign neoplasm of skin of lower limb 03/10/2021  ? Folliculitis 40/98/1191  ? Hyperlipidemia 10/31/2020  ? Disease of thyroid gland 10/31/2020  ? Iron deficiency anemia   ? Rectal bleeding   ? Symptomatic  anemia 08/06/2020  ? Secondary hypothyroidism   ? Depression   ? ? ?Past Medical History:  ?Diagnosis Date  ? Alcohol abuse   ? Anemia   ? Anxiety   ? Blood transfusion without reported diagnosis   ? Depression   ? Heart murmur   ? Heard in late 20s but not heard since  ? Hyperlipidemia   ? Hyperthyroidism   ? s/p radioactive iodine therapy. resolved.  ? Secondary hypothyroidism   ? ? ?Past Surgical History:  ?Procedure Laterality Date  ? BIOPSY  08/08/2020  ? Procedure: BIOPSY;  Surgeon: Thornton Park, MD;  Location: [facility];  Service: Endoscopy;;  ? COLONOSCOPY WITH PROPOFOL N/A 08/08/2020  ? Procedure: COLONOSCOPY WITH PROPOFOL;  Surgeon: Thornton Park, MD;  Location: [facility];  Service: Endoscopy;  Laterality: N/A;  ? ECTOPIC PREGNANCY SURGERY    ? x2, one with salpingectomy, other with salpingostomy  ? ESOPHAGOGASTRODUODENOSCOPY (EGD) WITH PROPOFOL N/A 08/08/2020  ? Procedure: ESOPHAGOGASTRODUODENOSCOPY (EGD) WITH PROPOFOL;  Surgeon: Thornton Park, MD;  Location: [facility];  Service: Endoscopy;  Laterality: N/A;  ? LAPAROSCOPIC APPENDECTOMY N/A 02/12/2013  ? Procedure: APPENDECTOMY LAPAROSCOPIC;  Surgeon: Gwenyth Ober, MD;  Location: [facility];  Service: General;  Laterality: N/A;  ? TUBAL LIGATION    ? WISDOM TOOTH EXTRACTION    ? ? ?Social History  ? ?Socioeconomic History  ? Marital status: Married  ?  Spouse name: Not on file  ? Number of children: 3  ? Years of education: Not on file  ? Highest education level: Not on file  ?Occupational History  ? Occupation: Clinical cytogeneticist  ?Tobacco Use  ? Smoking status: Former  ?  Packs/day: 0.50  ?  Years: 5.00  ?  Pack years: 2.50  ?  Types: Cigarettes  ? Smokeless tobacco: Never  ?Vaping Use  ? Vaping Use: Never used  ?Substance and Sexual Activity  ? Alcohol use: Yes  ?  Alcohol/week: 12.0 standard drinks  ?  Types: 12 Cans of beer per week  ? Drug use: No  ? Sexual activity: Yes  ?  Comment: post tubal ligation  ?Other Topics Concern  ? Not on  file  ?Social History Narrative  ? Not on file  ? ?Social Determinants of Health  ? ?Financial Resource Strain: Not on file  ?Food Insecurity: Not on file  ?Transportation Needs: Not on file  ?Physical Activity: Not on file  ?Stress: Not on file  ?Social Connections: Not on file  ?Intimate Partner Violence: Not on file  ? ? ?Family History  ?Problem Relation Age of Onset  ? Thyroid disease Mother   ? Heart disease Mother   ? Colon polyps Father   ? Heart disease Father   ? Kidney disease Father   ? Breast cancer Sister   ? Crohn's disease Sister   ? Ovarian cancer Maternal Aunt   ? Kidney disease Maternal Uncle   ? Depression Maternal Grandfather   ? Glaucoma Maternal Grandfather   ? Prostate cancer Neg Hx   ? Endometrial cancer Neg Hx   ? Pancreatic cancer Neg Hx   ? ? ?Medications Prior to Admission  ?Medication Sig Dispense Refill Last Dose  ? DULoxetine (CYMBALTA) 60 MG capsule Take 60 mg by mouth daily.   05/20/2021 at [facility]  ? escitalopram (LEXAPRO) 10 MG tablet Take 10 mg by mouth daily.   05/20/2021 at [facility]  ? folic acid (FOLVITE) 384 MCG tablet Take 800-1,600 mcg by mouth See admin instructions. 800 mcg every other day alternating with 1,600 mcg every other day   05/13/2021  ? levothyroxine (SYNTHROID) 125 MCG tablet Take 125 mcg by mouth See admin instructions. Mon - Fri   05/19/2021  ? levothyroxine (SYNTHROID) 175 MCG tablet Take 175 mcg by mouth See admin instructions. Sat and Sun     ? REPATHA SURECLICK 665 MG/ML SOAJ Inject 140 mg into the skin every 14 (fourteen) days.   05/14/2021  ? rosuvastatin (CRESTOR) 20 MG tablet Take 20 mg by mouth daily.   05/20/2021 at [place]  ? vitamin B-12 (CYANOCOBALAMIN) 1000 MCG tablet Take 2,000 mcg by mouth daily.   05/13/2021  ? ibuprofen (ADVIL) 800 MG tablet Take 1 tablet (800 mg total) by mouth every 8 (eight) hours as needed for moderate pain. For AFTER surgery only 30 tablet 0   ? senna-docusate (SENOKOT-S) 8.6-50 MG tablet Take 2 tablets by mouth at bedtime. For AFTER  surgery, do not take if having diarrhea 30 tablet 0   ? traMADol (ULTRAM) 50 MG tablet Take 1 tablet (50 mg total) by mouth every 6 (six) hours as needed for severe pain. For AFTER surgery, do not take and drive 30 tablet 0   ? ? ?Current Facility-Administered Medications  ?Medication Dose Route Frequency Provider Last Rate Last Admin  ? bupivacaine liposome (EXPAREL) 1.3 % injection 266 mg  20 mL Infiltration Once Michael Boston, MD      ? cefTRIAXone (ROCEPHIN) 2 g in sodium chloride 0.9 % 100 mL IVPB  2 g Intravenous On Call to OR Michael Boston, MD      ? Chlorhexidine Gluconate Cloth 2 % PADS 6 each  6 each Topical Once Michael Boston, MD      ? lactated ringers infusion   Intravenous  Continuous Myrtie Soman, MD 10 mL/hr at 05/20/21 0955 New Bag at 05/20/21 0955  ?  ? ?Allergies  ?Allergen Reactions  ? Codeine Itching  ?  Lips numb  ? Dilaudid [Hydromorphone Hcl] Itching  ? Fentanyl Itching  ? Levothyroxine Other (See Comments)  ?  Headaches ?Must use name brand Synthroid  ? ? ?BP (!) 193/83   Pulse 97   Temp 98.4 ?F (36.9 ?C) (Oral)   Resp 15   SpO2 98%  ? ?Labs: ?Results for orders placed or performed during the hospital encounter of 05/20/21 (from the past 48 hour(s))  ?Pregnancy, urine     Status: None  ? Collection Time: 05/20/21  9:26 AM  ?Result Value Ref Range  ? Preg Test, Ur NEGATIVE NEGATIVE  ?  Comment:        ?THE SENSITIVITY OF THIS ?METHODOLOGY IS >20 mIU/mL. ?Performed at [facility], [place] [address], [place], [place] [address] ?  ? ? ?Imaging / Studies: ?No results found. ?  ?.Adin Hector, M.D., F.A.C.S. ?Gastrointestinal and Minimally Invasive Surgery ?[facility] ?[address], [address] ?[place], [place] [address] ?[phone] Main / Paging ? ?05/20/2021 ?10:20 AM ? ? ? ?Adin Hector ? ? ?

## 2021-05-20 NOTE — Discharge Instructions (Addendum)
Medications:  - Take ibuprofen and tylenol first line for pain control. Take these regularly (every 6 hours) to decrease the build up of pain.  - If necessary, for severe pain not relieved by ibuprofen, take tramadol.  - While taking tramadol you should take sennakot every night to reduce the likelihood of constipation. If this causes diarrhea, stop its use.  Diet: 1. Low sodium Heart Healthy Diet is recommended.  2. It is safe to use a laxative if you have difficulty moving your bowels.   Wound Care: 1. Keep clean and dry.  Shower daily.  Reasons to call the Doctor:  Fever - Oral temperature greater than 100.4 degrees Fahrenheit Foul-smelling vaginal discharge Difficulty urinating Nausea and vomiting Increased pain at the site of the incision that is unrelieved with pain medicine. Difficulty breathing with or without chest pain New calf pain especially if only on one side Sudden, continuing increased vaginal bleeding with or without clots.   Follow-up: 1. See Jeral Pinch in 3 weeks. You will have a phone visit once pathology is back.  Contacts: For questions or concerns you should contact:  Dr. Jeral Pinch at [phone] After hours and on week-ends call [phone] and ask to speak to the physician on call for Gynecologic Oncology    ##########################  ##############################################  ANORECTAL SURGERY:  POST OPERATIVE INSTRUCTIONS  ######################################################################  EAT Start with a pureed / full liquid diet After 24 hours, gradually transition to a high fiber diet.    CONTROL PAIN Control pain so you can tolerate bowel movements,  walk, sleep, tolerate sneezing/coughing, and go up/down stairs.   HAVE A BOWEL MOVEMENT DAILY Keep your bowels regular to avoid problems.   Taking a fiber supplement every day to keep bowels soft.   Try a laxative to override constipation. Use an  antidairrheal to slow down diarrhea.   Call if not better after 2 tries  WALK Walk an hour a day.  Control your pain to do that.   CALL IF YOU HAVE PROBLEMS/CONCERNS Call if you are still struggling despite following these instructions. Call if you have concerns not answered by these instructions  ######################################################################    Take your usually prescribed home medications unless otherwise directed.  DIET: Follow a light bland diet & liquids the first 24 hours after arrival home, such as soup, liquids, starches, etc.  Be sure to drink plenty of fluids.  Quickly advance to a usual solid diet within a few days.  Avoid fast food or heavy meals as your are more likely to get nauseated or have irregular bowels.  A low-fat, high-fiber diet for the rest of your life is ideal.  PAIN CONTROL: Expect swelling and discomfort in the anus/rectal area. Pain is best controlled by a usual combination of many methods TOGETHER: Warm baths/soaks or Ice packs Over the counter pain medication Prescription pain medications Topical creams    Warm water baths or ice packs (30-60 minutes up to 8 times a day, especially after bowel meovements) will help. Use ice for the first few days to help decrease swelling and bruising, then switch to heat such as warm towels, sitz baths, warm baths, warm showers, etc to help relax tight/sore spots and speed recovery.  Some people prefer to use ice alone, heat alone, alternating between ice & heat.  Experiment to what works for you.    It is helpful to take an over-the-counter pain medication continuously for the first few weeks.  Choose one of the following that  works best for you: Naproxen (Aleve, etc)  Two 220mg  tabs twice a day Ibuprofen (Advil, etc) Three 200mg  tabs four times a day (every meal & bedtime) Acetaminophen (Tylenol, etc) 500-650mg  four times a day (every meal & bedtime)  A  prescription for pain medication (such  as oxycodone, hydrocodone, etc) should be given to you upon discharge.  Take your pain medication as prescribed.  If you are having problems/concerns with the prescription medicine (does not control pain, nausea, vomiting, rash, itching, etc), please call us [phone] to see if we need to switch you to a different pain medicine that will work better for you and/or control your side effect better. If you need a refill on your pain medication, please contact your pharmacy.  They will contact our office to request authorization. Prescriptions will not be filled after 5 pm or on week-ends.  If can take up to 48 hours for it to be filled & ready so avoid waiting until you are down to thel ast pill.  A topical cream (Dibucaine) or a prescription for a cream (such as diltiazem 2% gel) may be given to you.  Many people find relief with topical creams.  Some people find it burns too much.  Experiment.  If it helps, use it.  If it burns, don't using it.  You also may receive a prescription for diazepam, a muscle relaxant to help you to be able to urinate and defecate more easily.  It is safe to take a few doses with the other medications as long as you are not planning to drive or do anything intense.  Hopefully this can minimize the chance of needing a Foley catheter into your bladder     KEEP YOUR BOWELS REGULAR The goal is one soft bowel movement a day Avoid getting constipated.  Between the surgery and the pain medications, it is common to experience some constipation.  Increasing fluid intake and taking a fiber supplement (such as Metamucil, Citrucel, FiberCon, MiraLax, etc) 2-4 times a day regularly will usually help prevent this problem from occurring.  A mild laxative (prune juice, Milk of Magnesia, MiraLax, etc) should be taken according to package directions if there are no bowel movements after 48 hours. Watch out for diarrhea.  If you have many loose bowel movements, simplify your diet to bland  foods & liquids for a few days.  Stop any stool softeners and decrease your fiber supplement.  Switching to mild anti-diarrheal medications (Kayopectate, Pepto Bismol) can help.  Can try an imodium/loperamide dose.  If this worsens or does not improve, please call us.  Wound Care   a. You have some fluffed gauze on top of the anus to help catch drainage and bleeding.  Let the gauze fall off with the first bowel movement or shower.  It is okay to reinforce or replace as needed.  Bleeding is common at first and occasionally tapers off   b. Place soft cotton balls on the anus/wounds and use an absorbent pad in your underwear as needed to catch any drainage and help keep the area.  Try to use cotton over regular gauze as Kolls can stick and pull, causing pain.  Cotton will come off more easily.   c. Keep the area clean and dry.  Bathe / shower every day.  Keep the area clean by showering / bathing over the incision / wound.   It is okay to soak an open wound to help wash it.  Consider using a  squeeze bottle filled with warm water to gently wash the anal area.  Wet wipes or showers / gentle washing after bowel movements is often less traumatic than regular toilet paper.  Use a Sitz Bath 4-8 times a day for relief  A sitz bath is a warm water bath taken in the sitting position that covers only the hips and buttocks. It may be used for either healing or hygiene purposes. Sitz baths are also used to relieve pain, itching, or muscle spasms.  Gently cleaned the area and the heat will help lower spasm and offer better pain control.    Fill the bathtub half full with warm water. Sit in the water and open the drain a little. Turn on the warm water to keep the tub half full. Keep the water running constantly. Soak in the water for 15 to 20 minutes. After the sitz bath, pat the affected area dry first.   d. You will often notice bleeding, especially with bowel movements.  This should slow down by the end of the  first week of surgery.  Sitting on an ice pack can help.   e. Expect some drainage.  You often will have some blood or yellow drainage with open wounds.  Sometimes she will get a little leaking of liquid stool until the incision/wounds have fully close down.  This should slow down by the end of the first week of surgery, but you will have occasional bleeding or drainage up to a few months after surgery.  Wear an absorbent pad or soft cotton gauze in your underwear until the drainage stops.  ACTIVITIES as tolerated:    You may resume regular (light) daily activities beginning the next day--such as daily self-care, walking, climbing stairs--gradually increasing activities as tolerated.  If you can walk 30 minutes without difficulty, it is safe to try more intense activity such as jogging, treadmill, bicycling, low-impact aerobics, swimming, etc. Save the most intensive and strenuous activity for last such as sit-ups, heavy lifting, contact sports, etc  Refrain from any heavy lifting or straining until you are off narcotics for pain control.   DO NOT PUSH THROUGH PAIN.  Let pain be your guide: If it hurts to do something, don't do it.  Pain is your body warning you to avoid that activity for another week until the pain goes down. You may drive when you are no longer taking prescription pain medication, you can comfortably sit for long periods of time, and you can safely maneuver your car and apply brakes. You may have sexual intercourse when it is comfortable.   FOLLOW UP in our office Please call [facility] at [phone] to set up an appointment to see your surgeon in the office for a follow-up appointment approximately 2-3 weeks after your surgery. Make sure that you call for this appointment the day you arrive home to ensure a convenient appointment time.  8. IF YOU HAVE DISABILITY OR FAMILY LEAVE FORMS, BRING THEM TO THE OFFICE FOR PROCESSING.  DO NOT GIVE THEM TO YOUR DOCTOR.        WHEN TO  CALL us [phone]: Poor pain control Reactions / problems with new medications (rash/itching, nausea, etc)  Fever over 101.5 F (38.5 C) Inability to urinate Nausea and/or vomiting Worsening swelling or bruising Continued bleeding from incision. Increased pain, redness, or drainage from the incision  The clinic staff is available to answer your questions during regular business hours (8:30am-5pm).  Please dont hesitate to call and ask  to speak to one of our nurses for clinical concerns.   A surgeon from [facility] is always on call at the hospitals   If you have a medical emergency, go to the nearest emergency room or call 911.    [facility], [place], [place], [place], [place]  [address] ? MAIN: [phone] ? TOLL FREE: [phone] ? FAX [phone] [email or web address]  #####################################################

## 2021-05-20 NOTE — Op Note (Signed)
OPERATIVE NOTE ? ?Pre-operative Diagnosis: Complex adnexal mass, anemia in the setting of continued heavy menses and hemorrhoids ? ?Post-operative Diagnosis: same, benign left ovarian cyst, benign endometrium  ? ?Operation: Robotic-assisted laparoscopic bilateral salpingectomy, left oophorectomy, endometrial biopsy, hemorrhoid excision (Dr. Johney Maine) ? ?Surgeon: Jeral Pinch MD ? ?Assistant Surgeon: Lahoma Crocker MD (an MD assistant was necessary for tissue manipulation, management of robotic instrumentation, retraction and positioning due to the complexity of the case and hospital policies).  ? ?Anesthesia: GET ? ?Urine Output: 300cc ? ?Operative Findings: On EUA, small mobile uterus, no adnexal masses. On intra-abdominal entry, filmy adhesions between the omentum and anterior abdominal wall. Normal upper abdominal survey. Normal omentum, small and large bowels. Uterus 6-8 cm and normal in appearance. Left ovary 3 cm with cystic mass, 1 cm. Left tube normal in appearance, right partial tubal segments. Normal right ovary.  No ascites. NO intra-abdominal or pelvic evidence of disease.  ? ?Estimated Blood Loss:  75cc     ? ?Total IV Fluids: see I&O flowsheet ?        ?Specimens: bilateral tubes, left ovary, pelvic washings, endometrial biopsy ?        ?Complications:  None apparent; patient tolerated the procedure well. ?        ?Disposition: PACU - hemodynamically stable. ? ?Procedure Details  ?The patient was seen in the Holding Room. The risks, benefits, complications, treatment options, and expected outcomes were discussed with the patient.  The patient concurred with the proposed plan, giving informed consent.  The site of surgery properly noted/marked. The patient was identified as Jill Olsen and the procedure verified as a Robotic-assisted bilateral salpingo-oophorectomy with any other indicated procedures.  ? ?After induction of anesthesia, the patient was draped and prepped in the usual sterile  manner. Patient was placed in supine position after anesthesia and draped and prepped in the usual sterile manner as follows: Her arms were tucked to her side with all appropriate precautions.  The shoulders were stabilized with padded shoulder blocks applied to the acromium processes.  The patient was placed in the semi-lithotomy position in [place].  The perineum and vagina were prepped with CholoraPrep. The patient was draped after the CholoraPrep had been allowed to dry for 3 minutes.  A Time Out was held and the above information confirmed. ? ?The urethra was prepped with Betadine. Foley catheter was placed.  A sterile speculum was placed in the vagina.  The cervix was grasped with a single-tooth tenaculum. An EMB was performed with two passes and sent for frozen section. All instruments removed from the vagina. OG tube placement was confirmed and to suction.  ? ?Next, a 10 mm skin incision was made 1 cm below the subcostal margin in the midclavicular line.  The 5 mm Optiview port and scope was used for direct entry.  Opening pressure was under 10 mm CO2.  The abdomen was insufflated and the findings were noted as above.   At this point and all points during the procedure, the patient's intra-abdominal pressure did not exceed 15 mmHg. Next, an 8 mm skin incision was made superior to the umbilicus and a right and left port were placed about 8 cm lateral to the robot port on the right and left side.  A fourth arm was placed on the right.  The 5 mm assist trocar was exchanged for a 10-12 mm port. All ports were placed under direct visualization. Omental adhesions were lysed with sharp dissection. The patient was placed  in steep Trendelenburg.  Bowel was folded away into the upper abdomen.  The robot was docked in the normal manner. ? ?The left peritoneum were opened parallel to the IP ligament to open the retroperitoneal spaces bilaterally. The round ligaments were preserved. The ureter was noted to be on  the medial leaf of the broad ligament.  The peritoneum above the ureter was incised and stretched and the infundibulopelvic ligament was skeletonized, cauterized and cut.  The utero-ovarian ligament and fallopian tube were skeletonized, cauterized and transected just lateral to the uterine fundus, freeing the adnexa. The right fallopian tube was elevated and the fimbriated ends and proximal ends were both cauterized and transected from adjacent ovary and uterus. Bilateral tubes and left ovary were placed in an Endocatch bag and sent for frozen. ? ?Irrigation was used and excellent hemostasis was achieved.   ? ?While waiting for frozen, the robot was undocked and redocked for upper abdominal examination. Some minimal bleeding was noted from the omentum. Bipolar electrocautery was used to achieve hemostasis. ? ?Frozen section returned as benign. At this point in the procedure was completed.  Robotic instruments were removed under direct visulaization.  The robot was undocked. The fascia at the 10-12 mm port was closed with 0 Vicryl on a UR-5 needle.  The subcuticular tissue was closed with 4-0 Vicryl and the skin was closed with 4-0 Monocryl in a subcuticular manner.  Dermabond was applied.   ? ?Foley catheter was removed by Dr. Johney Maine.  All sponge, lap and needle counts were correct x  3.  ? ?The patient was transferred to the recovery room in stable condition after Dr. Johney Maine completed his portion. ? ?Jeral Pinch, MD ? ?

## 2021-05-20 NOTE — Anesthesia Postprocedure Evaluation (Signed)
Anesthesia Post Note ? ?Patient: Jill Olsen ? ?Procedure(s) Performed: XI ROBOTIC ASSISTED SALPINGECTOMY, ENDOMETRIAL BIOPSY (Bilateral) ?XI ROBOTIC ASSISTED UNILATERAL OOPHORECTOMY ?HEMORRHOIDECTOMY WITH LIGATION x 3 AND HEMORRHOIDOPEXY (Rectum) ?RECTAL EXAM UNDER ANESTHESIA ? ?  ? ?Patient location during evaluation: PACU ?Anesthesia Type: General ?Level of consciousness: awake and alert ?Pain management: pain level controlled ?Vital Signs Assessment: post-procedure vital signs reviewed and stable ?Respiratory status: spontaneous breathing, nonlabored ventilation, respiratory function stable and patient connected to nasal cannula oxygen ?Cardiovascular status: blood pressure returned to baseline and stable ?Postop Assessment: no apparent nausea or vomiting ?Anesthetic complications: no ? ? ?No notable events documented. ? ?Last Vitals:  ?Vitals:  ? 05/20/21 1445 05/20/21 1520  ?BP: (!) 159/84 (!) 142/70  ?Pulse: 91 93  ?Resp: 17 18  ?Temp: 36.7 ?C 36.6 ?C  ?SpO2: 94% 93%  ?  ?Last Pain:  ?Vitals:  ? 05/20/21 1520  ?TempSrc:   ?PainSc: 5   ? ? ?  ?  ?  ?  ?  ?  ? ?Micaela Stith L Lavere Shinsky ? ? ? ? ?

## 2021-05-20 NOTE — Op Note (Signed)
05/20/2021 ? ?1:01 PM ? ?PATIENT:  Jill Olsen  56 y.o. female ? ?Patient Care Team: ?Reynold Bowen, MD as PCP - General (Endocrinology) ?Donato Heinz, MD as PCP - Cardiology (Cardiology) ?Michael Boston, MD as Consulting Physician (General Surgery) ?Lafonda Mosses, MD as Consulting Physician (Gynecologic Oncology) ?Milus Banister, MD as Attending Physician (Gastroenterology) ? ?PRE-OPERATIVE DIAGNOSIS:  HEMORRHOIDS PROLAPSING GRADE 3 WITH BLEEDING ? ?POST-OPERATIVE DIAGNOSIS:  HEMORRHOIDS PROLAPSING GRADE 3 WITH BLEEDING ? ?PROCEDURE:   ?Internal and external hemorrhoidectomy x3 ?Internal hemorrhoidal ligation and pexy ?Anorectal examination under anesthesia ? ?SURGEON:  Adin Hector, MD ? ?ANESTHESIA:  ? ?General ?Anorectal & Local field block (0.25% bupivacaine with epinephrine mixed with Liposomal bupivacaine (Experel)  ? ?EBL:  Total I/O ?In: 100 [IV Piggyback:100] ?Out: - .  See operative record ? ?Delay start of Pharmacological VTE agent (>24hrs) due to surgical blood loss or risk of bleeding:  NO ? ?DRAINS: NONE ? ?SPECIMEN:   Internal & external hemorrhoid x3 ? ?DISPOSITION OF SPECIMEN:  PATHOLOGY ? ?COUNTS:  YES ? ?PLAN OF CARE: Discharge home after PACU ? ?PATIENT DISPOSITION:  PACU - hemodynamically stable. ? ?INDICATION: Pleasant patient with struggles with hemorrhoids.  Not able to be managed in the office despite an improved bowel regimen.  I recommended examination under anesthesia and surgical treatment: ? ?The anatomy & physiology of the anorectal region was discussed.  The pathophysiology of hemorrhoids and differential diagnosis was discussed.  Natural history risks without surgery was discussed.   I stressed the importance of a bowel regimen to have daily soft bowel movements to minimize progression of disease.  Interventions such as sclerotherapy & banding were discussed. ? ?The patient's symptoms are not adequately controlled by medicines and other non-operative  treatments.  I feel the risks & problems of no surgery outweigh the operative risks; therefore, I recommended surgery to treat the hemorrhoids by ligation, pexy, and possible resection. ? ?Risks such as bleeding, infection, need for further treatment, heart attack, death, and other risks were discussed.   I noted a good likelihood this will help address the problem.  Goals of post-operative recovery were discussed as well.  Possibility that this will not correct all symptoms was explained.  Post-operative pain, bleeding, constipation, urinary difficulties, and other problems after surgery were discussed.  We will work to minimize complications.   Educational handouts further explaining the pathology, treatment options, and bowel regimen were given as well.  Questions were answered.  The patient expresses understanding & wishes to proceed with surgery. ? ?Patient also had concern for ovarian/fallopian tube masses.  Robotic salpingectomy, possible oophorectomy offered by Dr. Jeral Pinch with gynecologic oncology.  Patient desired combination case.  The see Dr Charisse March separate history and or note ? ?OR FINDINGS: Enlarged internal hemorrhoids with significant external component x3 piles.  Right posterior largest grade 4.  Right anterior and left lateral grade 3 but with significant external component. ? ?No fissure fistula or abscess.  No condyloma.  No tumor.  No obvious pilonidal disease.  Mildly decreased but intact sphincter tone ? ?DESCRIPTION:  ? ?Informed consent was confirmed. Patient underwent general anesthesia without difficulty. Patient was placed into  lithotomy positioning.  Patient's abdomen pelvis was prepped and draped in a sterile fashion.  The perianal region was prepped and draped in sterile fashion. Surgical time-out confirmed our plan. ? ?Dr. Berline Lopes and her team proceeded to do robotic salpingo-oophorectomy.  Please see her separate note.  Meanwhile I worked below.  I  did digital rectal  examination and then transitioned over to anoscopy to get a sense of the anatomy.  Findings noted above.  ? ?I proceeded to do hemorrhoidal ligation and pexy.  I used a 2-0 Vicryl suture on a UR-6 needle in a figure-of-eight fashion 6 cm proximal to the anal verge.  I started at the largest hemorrhoid pile.  Because of redundant hemorrhoidal tissue too bulky to merely ligate or pexy, I excised the excess internal hemorrhoid piles longitudinally in a fusiform biconcave fashion, sparing the anal canal to avoid narrowing.  I then ran that stitch longitudinally more distally to close the hemorrhoidectomy wound to the anal verge over a large Parks self-retaining anal retractor to avoid narrowing of the anal canal.  I then tied that stitch down to cause a hemorrhoidopexy.   I also had to do an excision at the  right anterior, right posterior, and left lateral pile locations.  I then did hemorrhoidal ligation and pexy at the other 3 hemorrhoidal columns.  At the completion of this, all 6 anorectal columns were ligated and pexied in the classic hexagonal fashion (right anterior/lateral/posterior, left anterior/lateral/posterior).   ? ?I radially trimmed and closed the external part of the hemorrhoidectomy wounds with radial interrupted horizontal mattress 2-0 chromic suture over a large Sawyer anal retractor, leaving the last 5 mm open to allow natural drainage.   ?I redid anoscopy & examination.  At completion of this, all hemorrhoids had been removed or reduced into the rectum.  There is no more prolapse.  Internal & external anatomy was more more normal.  Hemostasis was good.  Fluffed gauze was on-laid over the perianal region.  No packing done.  Patient is being extubated go to go to the recovery room. ? ?I had discussed postop care in detail with the patient in the preop holding area.  Instructions for post-operative recovery and prescriptions are written. I discussed operative findings, updated the patient's status,  discussed probable steps to recovery, and gave postoperative recommendations to the patient's spouse, Jill Olsen.  Recommendations were made.  Questions were answered.  He expressed understanding & appreciation. ? ?Adin Hector, M.D., F.A.C.S. ?Gastrointestinal and Minimally Invasive Surgery ?[facility] ?[address] ?[place], [place] [address] ?[phone] Main / Paging ? ? ? ? ? ?

## 2021-05-20 NOTE — H&P (Signed)
Gynecologic Oncology H&P  05/20/21  HPI: The patient endorses rectal bleeding since October 2021.  She was hospitalized in May 2022 secondary to iron deficiency anemia, requiring blood transfusion and a dose of IV iron.  She underwent endoscopy and colonoscopy subsequently with findings of bulky external hemorrhoids, small internal hemorrhoids, diverticula, and nonbleeding gastric erosion.  She continues to have rectal bleeding multiple times a day for 3 weeks of the month.  Sometimes she will feel as if she needs to have a bowel movement and will pass only clots and bright red bleeding.  Other times she has bright red bleeding with her bowel movements.  On average, this happens 4-5 times a day.  Least 3 weeks of rectal bleeding occur the week before, during, and after her menses.   She was recently seen in the cancer center given iron deficiency anemia.  She saw Dede Query PA.  Given work-up, she is now undergoing repeat IV iron infusions; has had 2 so far, notes that stool is darker and more firm since starting infusions again.  She is scheduled to see Dr. Johney Maine on Monday regarding surgery for her hemorrhoids.   During work-up of her anemia, she saw Dr. Talbert Nan.  The patient continues to have regular menses and denies any intermenstrual bleeding.  She describes her bleeding is lasting approximately 6-7 days with 2 days of heavier bleeding, 2-3 days of light bleeding, and 2 days of heavy but dark brown bleeding.  She underwent pelvic ultrasound recently that showed uterus measuring 8.8 cm with a 5 mm endometrial lining.  Left ovary measured up to 3.6 cm with a 1.9 cm cyst and a 1 cm solid component in the cyst with blood flow.  Right ovary measured up to 3.3 cm and was noted to be normal in appearance.  No free fluid.  CA-125 was checked and normal at 9.   Patient denies any pelvic or abdominal pain.  She notes about 3 weeks of left-sided low back aching.  She does not remember injuring this area.  She  endorses a good appetite without nausea or emesis.  She notes ongoing fatigue.  She has increased shortness of breath during periods of anemia.  She endorses regular bowel movements other than what was previously described, denies any urinary symptoms.   Pelvic ultrasound exam on 1/17 showed uterus measuring 8.8 x 5.6 x 4.6 cm, endometrium is 4.9 mm and avascular.  Left ovary measures 3.6 x 2.6 x 2.4 cm with a 1.9 x 1.6 cm cystic lesion and a 1 cm solid component in the cyst with blood flow.  Right ovary 3.3 x 2.3 x 1.9 cm and normal in appearance.  No free fluid noted.  CA 125 was checked and normal, measuring 9.   Her family history is notable for breast cancer in her sister premenopausal.  Sister had genetic testing that was negative.  She also has a maternal aunt with a history of ovarian cancer.  Denies other GYN or colon cancer.   Her surgical history is notable for laparoscopic appendectomy, right tubal pregnancy that ruptured requiring removal of that fallopian tube as well as a left tubal pregnancy that was treated with a salpingostomy.   Patient lives in [place] with her husband and son.  She is working part-time as a Radiation protection practitioner  Past Medical/Surgical History: Past Medical History:  Diagnosis Date   Alcohol abuse    Anemia    Anxiety    Blood transfusion without reported diagnosis  Depression    Heart murmur    Heard in late 20s but not heard since   Hyperlipidemia    Hyperthyroidism    s/p radioactive iodine therapy. resolved.   Secondary hypothyroidism     Past Surgical History:  Procedure Laterality Date   BIOPSY  08/08/2020   Procedure: BIOPSY;  Surgeon: Thornton Park, MD;  Location: [facility];  Service: Endoscopy;;   COLONOSCOPY WITH PROPOFOL N/A 08/08/2020   Procedure: COLONOSCOPY WITH PROPOFOL;  Surgeon: Thornton Park, MD;  Location: [facility];  Service: Endoscopy;  Laterality: N/A;   ECTOPIC PREGNANCY SURGERY     x2, one with salpingectomy, other  with salpingostomy   ESOPHAGOGASTRODUODENOSCOPY (EGD) WITH PROPOFOL N/A 08/08/2020   Procedure: ESOPHAGOGASTRODUODENOSCOPY (EGD) WITH PROPOFOL;  Surgeon: Thornton Park, MD;  Location: [facility];  Service: Endoscopy;  Laterality: N/A;   LAPAROSCOPIC APPENDECTOMY N/A 02/12/2013   Procedure: APPENDECTOMY LAPAROSCOPIC;  Surgeon: Gwenyth Ober, MD;  Location: [facility];  Service: General;  Laterality: N/A;   TUBAL LIGATION     WISDOM TOOTH EXTRACTION      Family History  Problem Relation Age of Onset   Thyroid disease Mother    Heart disease Mother    Colon polyps Father    Heart disease Father    Kidney disease Father    Breast cancer Sister    Crohn's disease Sister    Ovarian cancer Maternal Aunt    Kidney disease Maternal Uncle    Depression Maternal Grandfather    Glaucoma Maternal Grandfather    Prostate cancer Neg Hx    Endometrial cancer Neg Hx    Pancreatic cancer Neg Hx     Social History   Socioeconomic History   Marital status: Married    Spouse name: Not on file   Number of children: 3   Years of education: Not on file   Highest education level: Not on file  Occupational History   Occupation: Clinical cytogeneticist  Tobacco Use   Smoking status: Former    Packs/day: 0.50    Years: 5.00    Pack years: 2.50    Types: Cigarettes   Smokeless tobacco: Never  Vaping Use   Vaping Use: Never used  Substance and Sexual Activity   Alcohol use: Yes    Alcohol/week: 12.0 standard drinks    Types: 12 Cans of beer per week   Drug use: No   Sexual activity: Yes    Comment: post tubal ligation  Other Topics Concern   Not on file  Social History Narrative   Not on file   Social Determinants of Health   Financial Resource Strain: Not on file  Food Insecurity: Not on file  Transportation Needs: Not on file  Physical Activity: Not on file  Stress: Not on file  Social Connections: Not on file    Current Medications:  Current Facility-Administered Medications:     bupivacaine liposome (EXPAREL) 1.3 % injection 266 mg, 20 mL, Infiltration, Once, Gross, Remo Lipps, MD   cefTRIAXone (ROCEPHIN) 2 g in sodium chloride 0.9 % 100 mL IVPB, 2 g, Intravenous, On Call to OR, Michael Boston, MD   6 CHG cloth bath night before surgery, , , Once **AND** [START ON 05/21/2021] 6 CHG cloth bath AM of surgery, , , Once **AND** [DISCONTINUED] Chlorhexidine Gluconate Cloth 2 % PADS 6 each, 6 each, Topical, Once **AND** Chlorhexidine Gluconate Cloth 2 % PADS 6 each, 6 each, Topical, Once, Michael Boston, MD   lactated ringers infusion, , Intravenous, Continuous, Rose,  Iona Beard, MD, Last Rate: 10 mL/hr at 05/20/21 0955, New Bag at 05/20/21 0955  Review of Systems: Pertinent positives include fatigue, intermittent shortness of breath, itching, back pain, headache, anxiety, depression, decreased concentration. Denies appetite changes, fevers, chills, unexplained weight changes. Denies hearing loss, neck lumps or masses, mouth sores, ringing in ears or voice changes. Denies cough or wheezing.   Denies chest pain or palpitations. Denies leg swelling. Denies abdominal distention, pain, blood in stools, constipation, diarrhea, nausea, vomiting, or early satiety. Denies pain with intercourse, dysuria, frequency, hematuria or incontinence. Denies hot flashes, pelvic pain, vaginal bleeding or vaginal discharge.   Denies joint pain or muscle pain/cramps. Denies rash, or wounds. Denies dizziness, numbness or seizures. Denies swollen lymph nodes or glands, denies easy bruising or bleeding. Denies confusion.  Physical Exam: BP (!) 193/83    Pulse 97    Temp 98.4 F (36.9 C) (Oral)    Resp 15    SpO2 98%  General: Alert, oriented, no acute distress.  HEENT: Normocephalic, atraumatic. Sclera anicteric.  Chest: Clear to auscultation bilaterally. No wheezes, rhonchi, or rales. Cardiovascular: Regular rate and rhythm, no murmurs, rubs, or gallops.  Abdomen: Obese. Normoactive bowel sounds. Soft,  nondistended, nontender to palpation. No masses or hepatosplenomegaly appreciated. No palpable fluid wave.  Multiple well-healed incisions. Extremities: Grossly normal range of motion. Warm, well perfused. No edema bilaterally.   Laboratory & Radiologic Studies: CBC    Component Value Date/Time   WBC 4.3 05/07/2021 0843   RBC 3.98 05/07/2021 0843   HGB 10.1 (L) 05/07/2021 0843   HGB 7.1 (L) 03/27/2021 1016   HCT 34.5 (L) 05/07/2021 0843   PLT 239 05/07/2021 0843   PLT 316 03/27/2021 1016   MCV 86.7 05/07/2021 0843   MCH 25.4 (L) 05/07/2021 0843   MCHC 29.3 (L) 05/07/2021 0843   RDW 31.0 (H) 05/07/2021 0843   LYMPHSABS 1.0 03/27/2021 1016   MONOABS 0.6 03/27/2021 1016   EOSABS 0.1 03/27/2021 1016   BASOSABS 0.1 03/27/2021 1016   BMP Latest Ref Rng & Units 05/07/2021 03/27/2021 02/16/2021  Glucose 70 - 99 mg/dL 84 91 88  BUN 6 - 20 mg/dL 8 <5(L) 6  Creatinine 0.44 - 1.00 mg/dL 0.42(L) 0.50 0.45  Sodium 135 - 145 mmol/L 138 138 136  Potassium 3.5 - 5.1 mmol/L 4.5 3.8 3.6  Chloride 98 - 111 mmol/L 104 101 100  CO2 22 - 32 mmol/L 28 28 26   Calcium 8.9 - 10.3 mg/dL 8.9 9.2 8.7(L)   Assessment & Plan: Jill Olsen is a 56 y.o. woman with iron-deficiency anemia secondary to blood loss from hemorrhoids incidentally found to have small complex ovarian mass.   Plan for diagnostic surgery with USO and frozen section. Patient would like to keep contralateral ovary if no borderline tumor or malignancy. If any portion of tube removed at time of ectopic pregnancy is still intact, will plan for its removal. See note on 1/20 regarding counseling for procedures planned today.   Dr. Johney Maine will be performing hemorrhoid surgery.  Jill Pinch, MD  Division of Gynecologic Oncology  Department of Obstetrics and Gynecology  [facility]

## 2021-05-20 NOTE — Progress Notes (Signed)
Per pt, Last Menstrual cycle 2/21 thru 05/19/21. ?

## 2021-05-21 ENCOUNTER — Encounter (HOSPITAL_COMMUNITY): Payer: Self-pay | Admitting: Gynecologic Oncology

## 2021-05-21 ENCOUNTER — Telehealth: Payer: Self-pay | Admitting: *Deleted

## 2021-05-21 ENCOUNTER — Telehealth: Payer: Self-pay | Admitting: Gynecologic Oncology

## 2021-05-21 LAB — SURGICAL PATHOLOGY

## 2021-05-21 NOTE — Telephone Encounter (Signed)
Attempt to contact pt today for post op check up. Pt did not answer phone. LVM for call back.  ?

## 2021-05-21 NOTE — Telephone Encounter (Signed)
Spoke with Jill Olsen this morning. She states she is eating, drinking and urinating well. She has not had a BM yet and is not passing gas. She is taking senokot as prescribed and encouraged her to drink plenty of water. She stated that she is going to take Milk of Mag to help her have a BM. She denies fever or chills. Incisions are dry and intact. She rates her pain 3/10. Her pain is controlled with ibuprofen and tramadol for break through pain. She is also using heat and cold pack for pain management as well.   Instructed to call office with any fever, chills, purulent drainage, uncontrolled pain or any other questions or concerns. Patient verbalizes understanding.  ? ?Pt aware of post op appointment with Dr. Berline Lopes on 06/11/21 at 2pm as well as the office number [phone] and after hours number [phone] to call if she has any questions or concerns  ?

## 2021-05-21 NOTE — Telephone Encounter (Signed)
Called the patient to discuss pathology results.  She is very happy with the news that everything was benign.  Discussed that distal and proximal portion of the right fallopian tube were still in place and that I removed these.  I think because both procedures were going on at the same time, there was some confusion about whether the right fallopian tube needed to be sent.  It ultimately was not sent to pathology. ? ?Patient is overall feeling well after surgery.  Reviewed continued expectations. ? ?Jeral Pinch MD ?Gynecologic Oncology ? ?

## 2021-05-22 ENCOUNTER — Inpatient Hospital Stay: Payer: 59 | Admitting: Physician Assistant

## 2021-05-22 ENCOUNTER — Inpatient Hospital Stay: Payer: 59

## 2021-05-22 LAB — CYTOLOGY - NON PAP

## 2021-05-27 ENCOUNTER — Telehealth: Payer: Self-pay

## 2021-05-27 ENCOUNTER — Encounter: Payer: Self-pay | Admitting: Gynecologic Oncology

## 2021-05-27 NOTE — Telephone Encounter (Signed)
Following up with Jill Olsen regarding her incisions. Per Jill John, NP advised patient to remove the remaining skin glue from her incisions, she can use Vaseline or nail polish remover to do this. Once skin glue is gone patient can apply hydrocortisone cream to the affected area. Patient verbalized understanding.  ? ?Patient requesting refill on ibuprofen '800mg'$ . She reports only having one pill left. Advised patient that the refill request will be submitted but may not be ready for her tonight. Advised to take over the counter ibuprofen in the meantime if needed. Patient did not know she could take over the counter and will do this instead, she does not want to refill called in. ? ?Instructed patient to call with any questions or concerns.  ?

## 2021-05-27 NOTE — Telephone Encounter (Signed)
Received call from Ms. Krinke this morning. Patient states "I have some redness around 2 of my incisions. It's not the actual incision but around it and it's itchy. I just want to know what I should use for this." Patient denies fever, chills, swelling, or drainage. Patient is going to send images of incisions via mychart. Instructed that once images have been reviewed someone from our office will follow up with her. Patient verbalized understanding.  ?

## 2021-06-10 ENCOUNTER — Encounter: Payer: Self-pay | Admitting: Gynecologic Oncology

## 2021-06-11 ENCOUNTER — Other Ambulatory Visit: Payer: Self-pay

## 2021-06-11 ENCOUNTER — Encounter: Payer: Self-pay | Admitting: Gynecologic Oncology

## 2021-06-11 ENCOUNTER — Inpatient Hospital Stay: Payer: 59 | Attending: Physician Assistant | Admitting: Gynecologic Oncology

## 2021-06-11 VITALS — BP 136/71 | HR 107 | Temp 99.0°F | Resp 18 | Ht 61.02 in | Wt 149.1 lb

## 2021-06-11 DIAGNOSIS — T7840XA Allergy, unspecified, initial encounter: Secondary | ICD-10-CM

## 2021-06-11 DIAGNOSIS — Z9079 Acquired absence of other genital organ(s): Secondary | ICD-10-CM

## 2021-06-11 DIAGNOSIS — Z90721 Acquired absence of ovaries, unilateral: Secondary | ICD-10-CM

## 2021-06-11 DIAGNOSIS — N9489 Other specified conditions associated with female genital organs and menstrual cycle: Secondary | ICD-10-CM

## 2021-06-11 NOTE — Progress Notes (Signed)
Gynecologic Oncology Return Clinic Visit ? ?06/11/21 ? ?Reason for Visit: follow-up after surgery ? ?Treatment History: ?05/20/21: Robotic BS, left oophorectomy, EMB, hemorrhoid excision (Dr. Johney Maine) in the setting of complex adnexal mass, AUB and hemorrhoids resulting in chronic anemia. ? ?Interval History: ?Doing well after surgery. ?Denies any rectal bleeding.  Denies any vaginal bleeding or discharge.  Due for her menses next week. ?Endorses normal urination. ?Having 3 or 4 bowel movements a day which she described as soft.  Stool is green, which was intermittently treated prior to surgery. ?Irritation around her incisions had significantly improved after she took the Dermabond off.  She has just some very mild redness around her right to lateral incisions.  Denies any symptoms including pain or pruritus. ? ?Past Medical/Surgical History: ?Past Medical History:  ?Diagnosis Date  ? Alcohol abuse   ? Anemia   ? Anxiety   ? Blood transfusion without reported diagnosis   ? Depression   ? Heart murmur   ? Heard in late 20s but not heard since  ? Hyperlipidemia   ? Hyperthyroidism   ? s/p radioactive iodine therapy. resolved.  ? Secondary hypothyroidism   ? ? ?Past Surgical History:  ?Procedure Laterality Date  ? BIOPSY  08/08/2020  ? Procedure: BIOPSY;  Surgeon: Thornton Park, MD;  Location: [facility];  Service: Endoscopy;;  ? COLONOSCOPY WITH PROPOFOL N/A 08/08/2020  ? Procedure: COLONOSCOPY WITH PROPOFOL;  Surgeon: Thornton Park, MD;  Location: [facility];  Service: Endoscopy;  Laterality: N/A;  ? ECTOPIC PREGNANCY SURGERY    ? x2, one with salpingectomy, other with salpingostomy  ? ESOPHAGOGASTRODUODENOSCOPY (EGD) WITH PROPOFOL N/A 08/08/2020  ? Procedure: ESOPHAGOGASTRODUODENOSCOPY (EGD) WITH PROPOFOL;  Surgeon: Thornton Park, MD;  Location: [facility];  Service: Endoscopy;  Laterality: N/A;  ? HEMORRHOID SURGERY N/A 05/20/2021  ? Procedure: HEMORRHOIDECTOMY WITH LIGATION x 3 AND HEMORRHOIDOPEXY;   Surgeon: Michael Boston, MD;  Location: [facility];  Service: General;  Laterality: N/A;  ? LAPAROSCOPIC APPENDECTOMY N/A 02/12/2013  ? Procedure: APPENDECTOMY LAPAROSCOPIC;  Surgeon: Gwenyth Ober, MD;  Location: [place];  Service: General;  Laterality: N/A;  ? RECTAL EXAM UNDER ANESTHESIA N/A 05/20/2021  ? Procedure: RECTAL EXAM UNDER ANESTHESIA;  Surgeon: Michael Boston, MD;  Location: [facility];  Service: General;  Laterality: N/A;  ? TUBAL LIGATION    ? WISDOM TOOTH EXTRACTION    ? XI ROBOTIC ASSISTED OOPHORECTOMY N/A 05/20/2021  ? Procedure: XI ROBOTIC ASSISTED UNILATERAL OOPHORECTOMY;  Surgeon: Lafonda Mosses, MD;  Location: [facility];  Service: Gynecology;  Laterality: N/A;  ? XI ROBOTIC ASSISTED SALPINGECTOMY Bilateral 05/20/2021  ? Procedure: XI ROBOTIC ASSISTED SALPINGECTOMY, ENDOMETRIAL BIOPSY;  Surgeon: Lafonda Mosses, MD;  Location: [facility];  Service: Gynecology;  Laterality: Bilateral;  ? ? ?Family History  ?Problem Relation Age of Onset  ? Thyroid disease Mother   ? Heart disease Mother   ? Colon polyps Father   ? Heart disease Father   ? Kidney disease Father   ? Breast cancer Sister   ? Crohn's disease Sister   ? Ovarian cancer Maternal Aunt   ? Kidney disease Maternal Uncle   ? Depression Maternal Grandfather   ? Glaucoma Maternal Grandfather   ? Prostate cancer Neg Hx   ? Endometrial cancer Neg Hx   ? Pancreatic cancer Neg Hx   ? ? ?Social History  ? ?Socioeconomic History  ? Marital status: Married  ?  Spouse name: Not on file  ? Number of children: 3  ?  Years of education: Not on file  ? Highest education level: Not on file  ?Occupational History  ? Occupation: Clinical cytogeneticist  ?Tobacco Use  ? Smoking status: Former  ?  Packs/day: 0.50  ?  Years: 5.00  ?  Pack years: 2.50  ?  Types: Cigarettes  ? Smokeless tobacco: Never  ?Vaping Use  ? Vaping Use: Never used  ?Substance and Sexual Activity  ? Alcohol use: Yes  ?  Alcohol/week: 12.0 standard drinks  ?  Types: 12 Cans of beer per week  ? Drug use: No  ? Sexual  activity: Yes  ?  Comment: post tubal ligation  ?Other Topics Concern  ? Not on file  ?Social History Narrative  ? Not on file  ? ?Social Determinants of Health  ? ?Financial Resource Strain: Not on file  ?Food Insecurity: Not on file  ?Transportation Needs: Not on file  ?Physical Activity: Not on file  ?Stress: Not on file  ?Social Connections: Not on file  ? ? ?Current Medications: ? ?Current Outpatient Medications:  ?  DULoxetine (CYMBALTA) 60 MG capsule, Take 60 mg by mouth daily., Disp: , Rfl:  ?  escitalopram (LEXAPRO) 10 MG tablet, Take 10 mg by mouth daily., Disp: , Rfl:  ?  folic acid (FOLVITE) 174 MCG tablet, Take 800-1,600 mcg by mouth See admin instructions. 800 mcg every other day alternating with 1,600 mcg every other day, Disp: , Rfl:  ?  Lidocaine, Anorectal, (RECTICARE) 5 % CREA, Apply topically., Disp: , Rfl:  ?  REPATHA SURECLICK 944 MG/ML SOAJ, Inject 140 mg into the skin every 14 (fourteen) days., Disp: , Rfl:  ?  rosuvastatin (CRESTOR) 20 MG tablet, Take 20 mg by mouth daily., Disp: , Rfl:  ?  SYNTHROID 200 MCG tablet, Take by mouth., Disp: , Rfl:  ?  vitamin B-12 (CYANOCOBALAMIN) 1000 MCG tablet, Take 2,000 mcg by mouth daily., Disp: , Rfl:  ?  diazepam (VALIUM) 5 MG tablet, Take 1 tablet (5 mg total) by mouth every 8 (eight) hours as needed for muscle spasms (difficulty urinating). (Patient not taking: Reported on 06/10/2021), Disp: 6 tablet, Rfl: 1 ?  ibuprofen (ADVIL) 800 MG tablet, Take 1 tablet (800 mg total) by mouth every 8 (eight) hours as needed for moderate pain. For AFTER surgery only (Patient not taking: Reported on 06/10/2021), Disp: 30 tablet, Rfl: 0 ?  oxyCODONE (OXY IR/ROXICODONE) 5 MG immediate release tablet, Take 1 tablet (5 mg total) by mouth every 6 (six) hours as needed for moderate pain, severe pain or breakthrough pain. (Patient not taking: Reported on 06/10/2021), Disp: 30 tablet, Rfl: 0 ?  senna-docusate (SENOKOT-S) 8.6-50 MG tablet, Take 2 tablets by mouth at bedtime.  For AFTER surgery, do not take if having diarrhea (Patient not taking: Reported on 06/10/2021), Disp: 30 tablet, Rfl: 0 ?  traMADol (ULTRAM) 50 MG tablet, Take 1 tablet (50 mg total) by mouth every 6 (six) hours as needed for severe pain. For AFTER surgery, do not take and drive (Patient not taking: Reported on 06/10/2021), Disp: 30 tablet, Rfl: 0 ? ?Review of Systems: ?Denies appetite changes, fevers, chills, fatigue, unexplained weight changes. ?Denies hearing loss, neck lumps or masses, mouth sores, ringing in ears or voice changes. ?Denies cough or wheezing.  Denies shortness of breath. ?Denies chest pain or palpitations. Denies leg swelling. ?Denies abdominal distention, pain, blood in stools, constipation, diarrhea, nausea, vomiting, or early satiety. ?Denies pain with intercourse, dysuria, frequency, hematuria or incontinence. ?Denies hot flashes, pelvic pain,  vaginal bleeding or vaginal discharge.   ?Denies joint pain, back pain or muscle pain/cramps. ?Denies itching, rash, or wounds. ?Denies dizziness, headaches, numbness or seizures. ?Denies swollen lymph nodes or glands, denies easy bruising or bleeding. ?Denies anxiety, depression, confusion, or decreased concentration. ? ?Physical Exam: ?BP 136/71 (BP Location: Left Arm, Patient Position: Sitting)   Pulse (!) 107   Temp 99 ?F (37.2 ?C) (Oral)   Resp 18   Ht 5' 1.02" (1.55 m)   Wt 149 lb 1.6 oz (67.6 kg)   LMP 05/12/2021 (Exact Date) Comment: until 2/28  SpO2 100%   BMI 28.15 kg/m?  ?General: Alert, oriented, no acute distress. ?HEENT: Normocephalic, atraumatic, sclera anicteric. ?Chest: Unlabored breathing on room air. ?Abdomen: soft, nontender.  Normoactive bowel sounds.  No masses or hepatosplenomegaly appreciated.  Well-healed incisions, very mild erythema around right to incisions, nonblanching.  Incisions otherwise clean, dry, intact. ?Extremities: Grossly normal range of motion.  Warm, well perfused.  No edema bilaterally. ?Skin: No rashes  or lesions noted. ? ?Laboratory & Radiologic Studies: ?A. ENDOMETRIUM, BIOPSY:  ?- Inactive endometrium.  ?- No hyperplasia or malignancy.  ? ?B. OVARY AND FALLOPIAN TUBE, LEFT, SALPINGO OOPHORECTOMY:  ?- Corpus

## 2021-06-11 NOTE — Patient Instructions (Signed)
It was good to see you today. You are healing well from surgery. Remember, no heavy lifting for 6 weeks after surgery. ? ?I am releasing you back to your OBGYN and PCP. Please don't hesitate to reach out if you need anything in the future. ?

## 2021-08-10 ENCOUNTER — Inpatient Hospital Stay: Admission: RE | Admit: 2021-08-10 | Payer: 59 | Source: Ambulatory Visit

## 2021-08-13 ENCOUNTER — Inpatient Hospital Stay: Admission: RE | Admit: 2021-08-13 | Payer: 59 | Source: Ambulatory Visit

## 2022-12-24 IMAGING — US US ABDOMEN COMPLETE
1 series · 14 of 25 positions shown · non-contrast
Comparison: None.

CLINICAL DATA: Evaluate for chronic liver disease.

EXAM:
ABDOMEN ULTRASOUND COMPLETE

[Series 1: us abdomen complete · 14 of 218 slices shown]
[im 1/218]
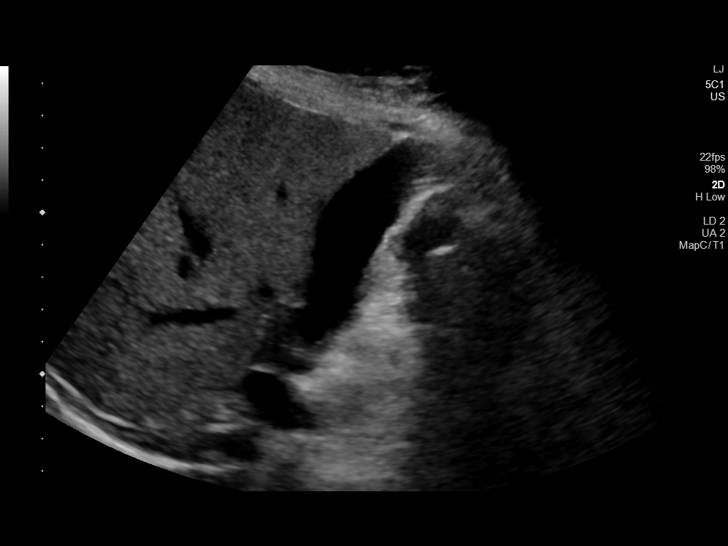
[im 19/218]
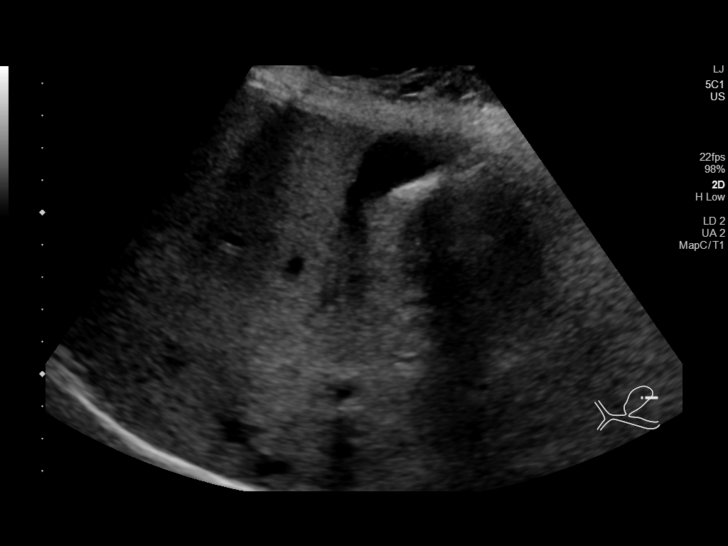
[im 37/218]
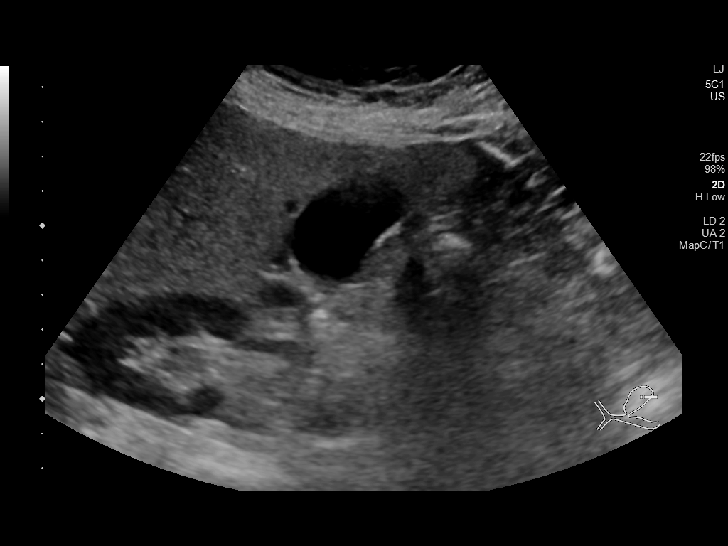
[im 55/218]
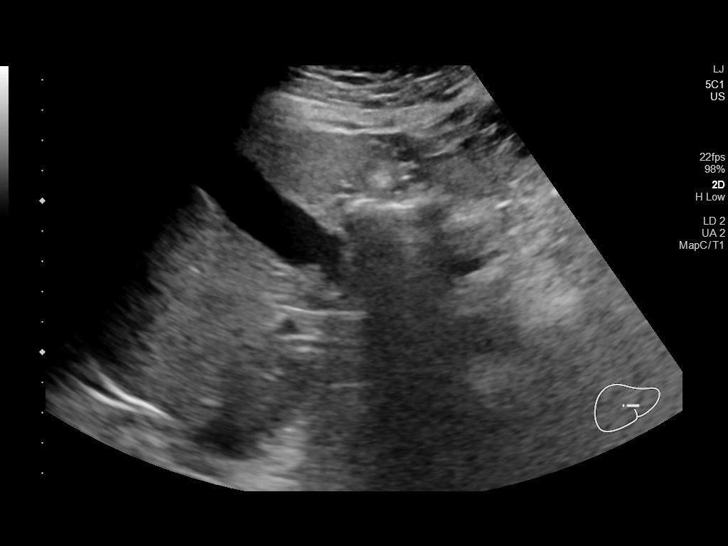
[im 73/218]
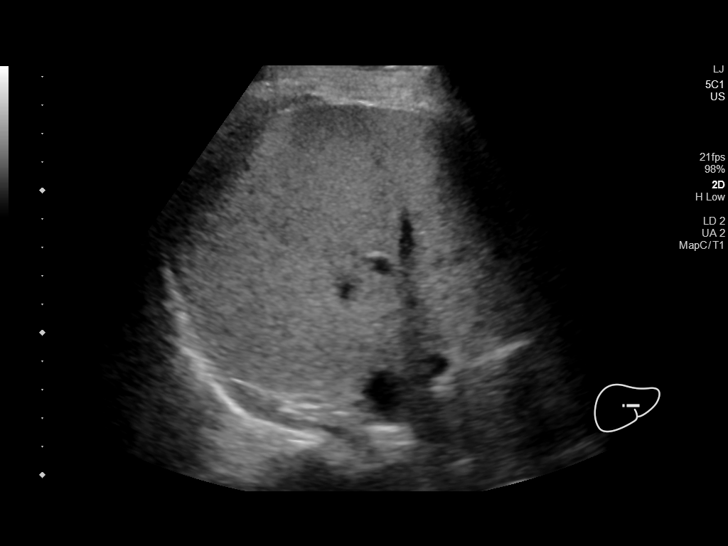
[im 82/218]
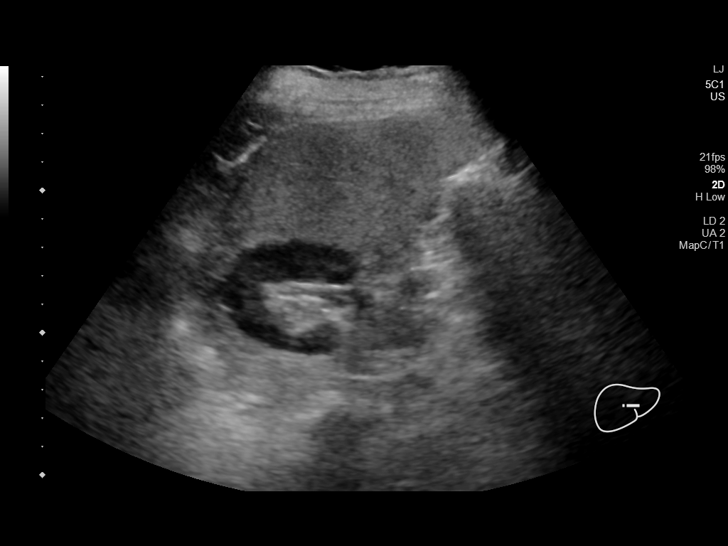
[im 100/218]
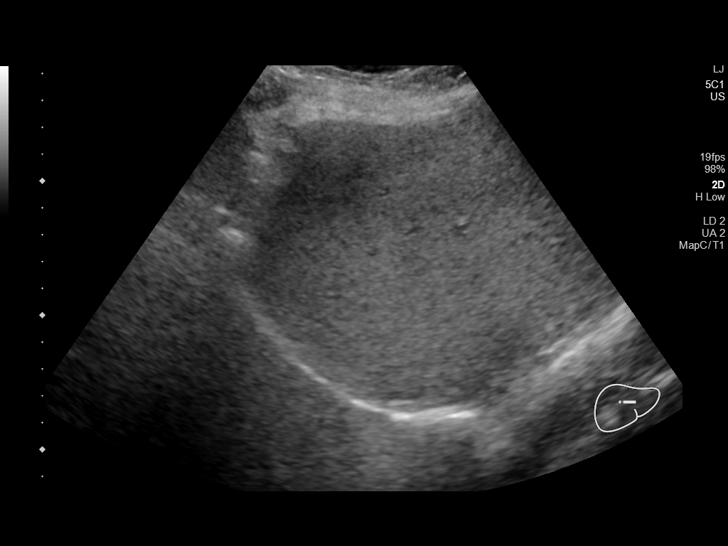
[im 118/218]
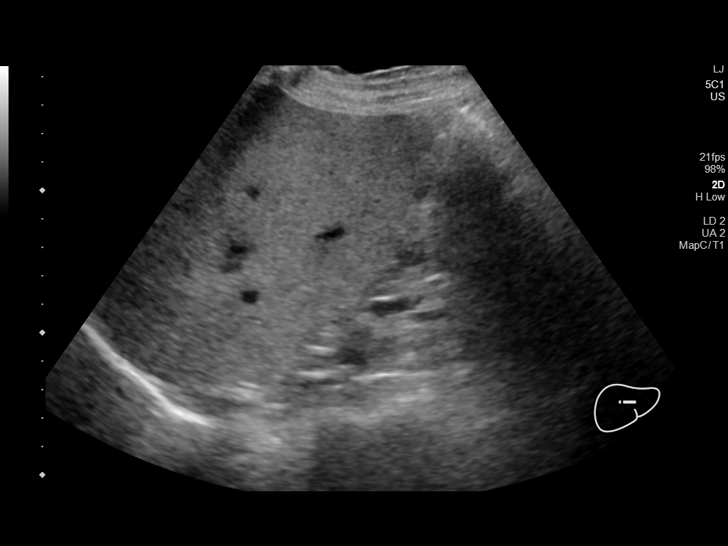
[im 136/218]
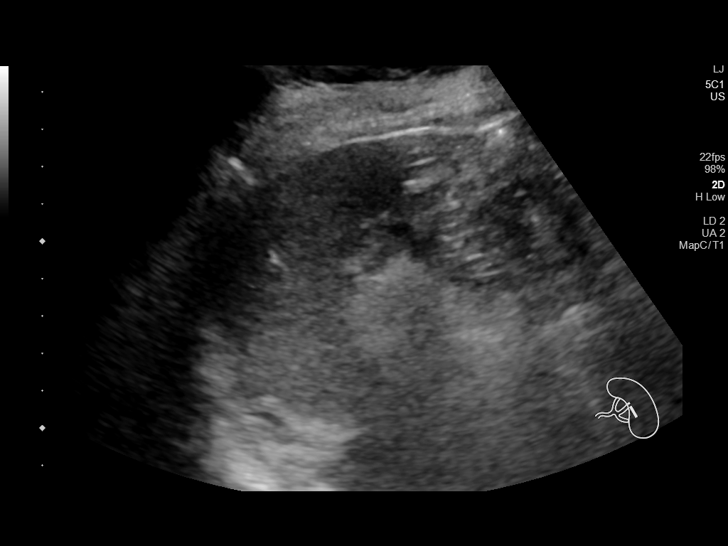
[im 145/218]
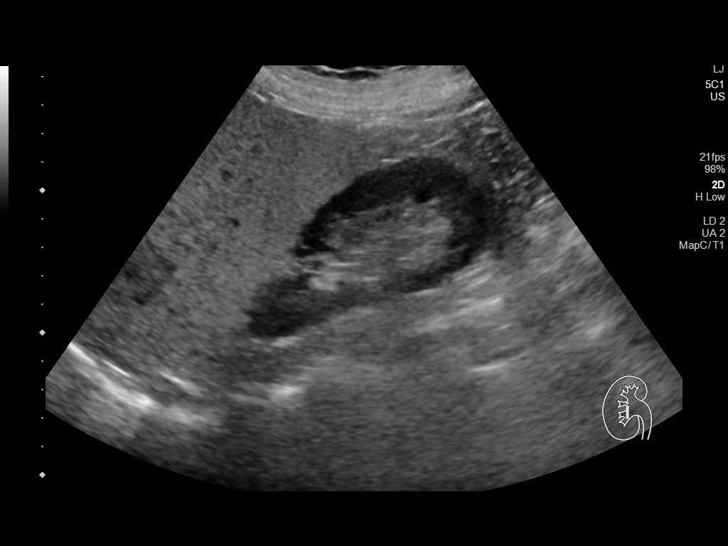
[im 163/218]
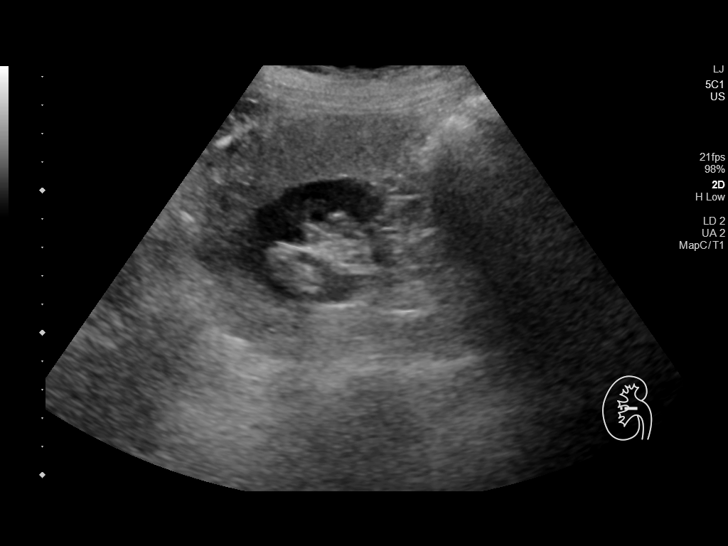
[im 181/218]
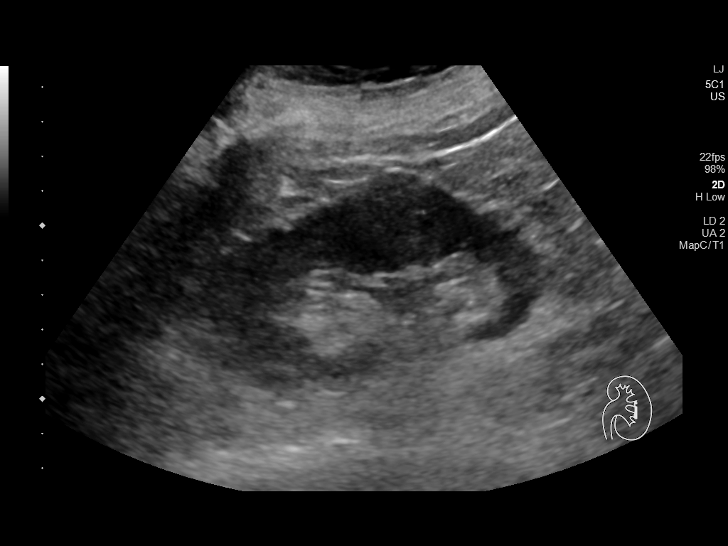
[im 199/218]
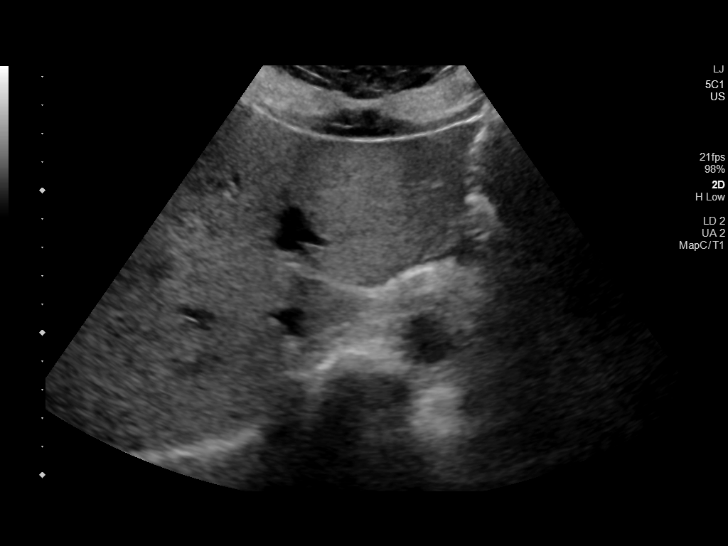
[im 218/218]
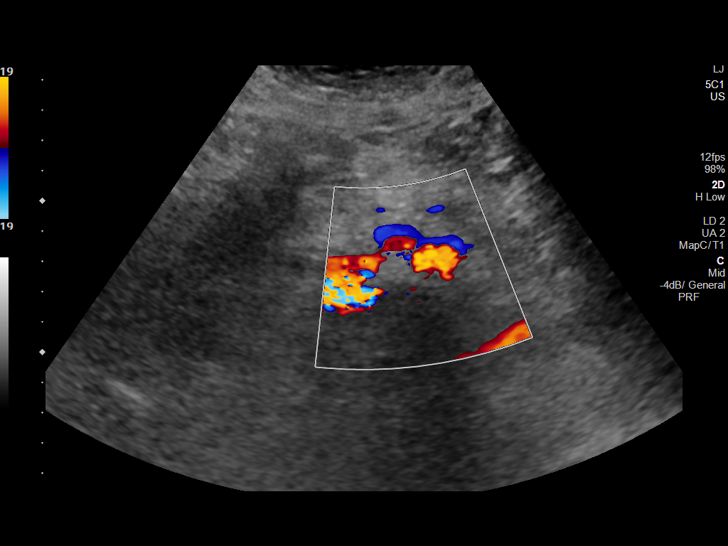

[14 of 25 positions shown; findings below may reference images not displayed]

FINDINGS: Gallbladder: No gallstones or wall thickening visualized (3.0 mm).
No sonographic Murphy sign noted by sonographer.

Common bile duct: Diameter: 3.0 mm

Liver: No focal lesion identified. Diffusely increased echogenicity
of the liver parenchyma is noted. Portal vein is patent on color
Doppler imaging with normal direction of blood flow towards the
liver.

IVC: Poorly visualized.

Pancreas: Visualized portion unremarkable.

Spleen: Size (6.7 cm) and appearance within normal limits.

Right Kidney: Length: 9.7 cm. Echogenicity within normal limits. No
mass or hydronephrosis visualized.

Left Kidney: Length: 9.6 cm. Echogenicity within normal limits. No
mass or hydronephrosis visualized.

Abdominal aorta: No aneurysm visualized (2.3 cm in AP diameter).

Other findings: None.
IMPRESSION: Fatty liver.

## 2023-07-04 IMAGING — CR DG CHEST 2V
2 series · 2 of 2 positions shown · non-contrast
Comparison: None.

CLINICAL DATA: Chest pain.

EXAM:
CHEST - 2 VIEW

[w chest pa]
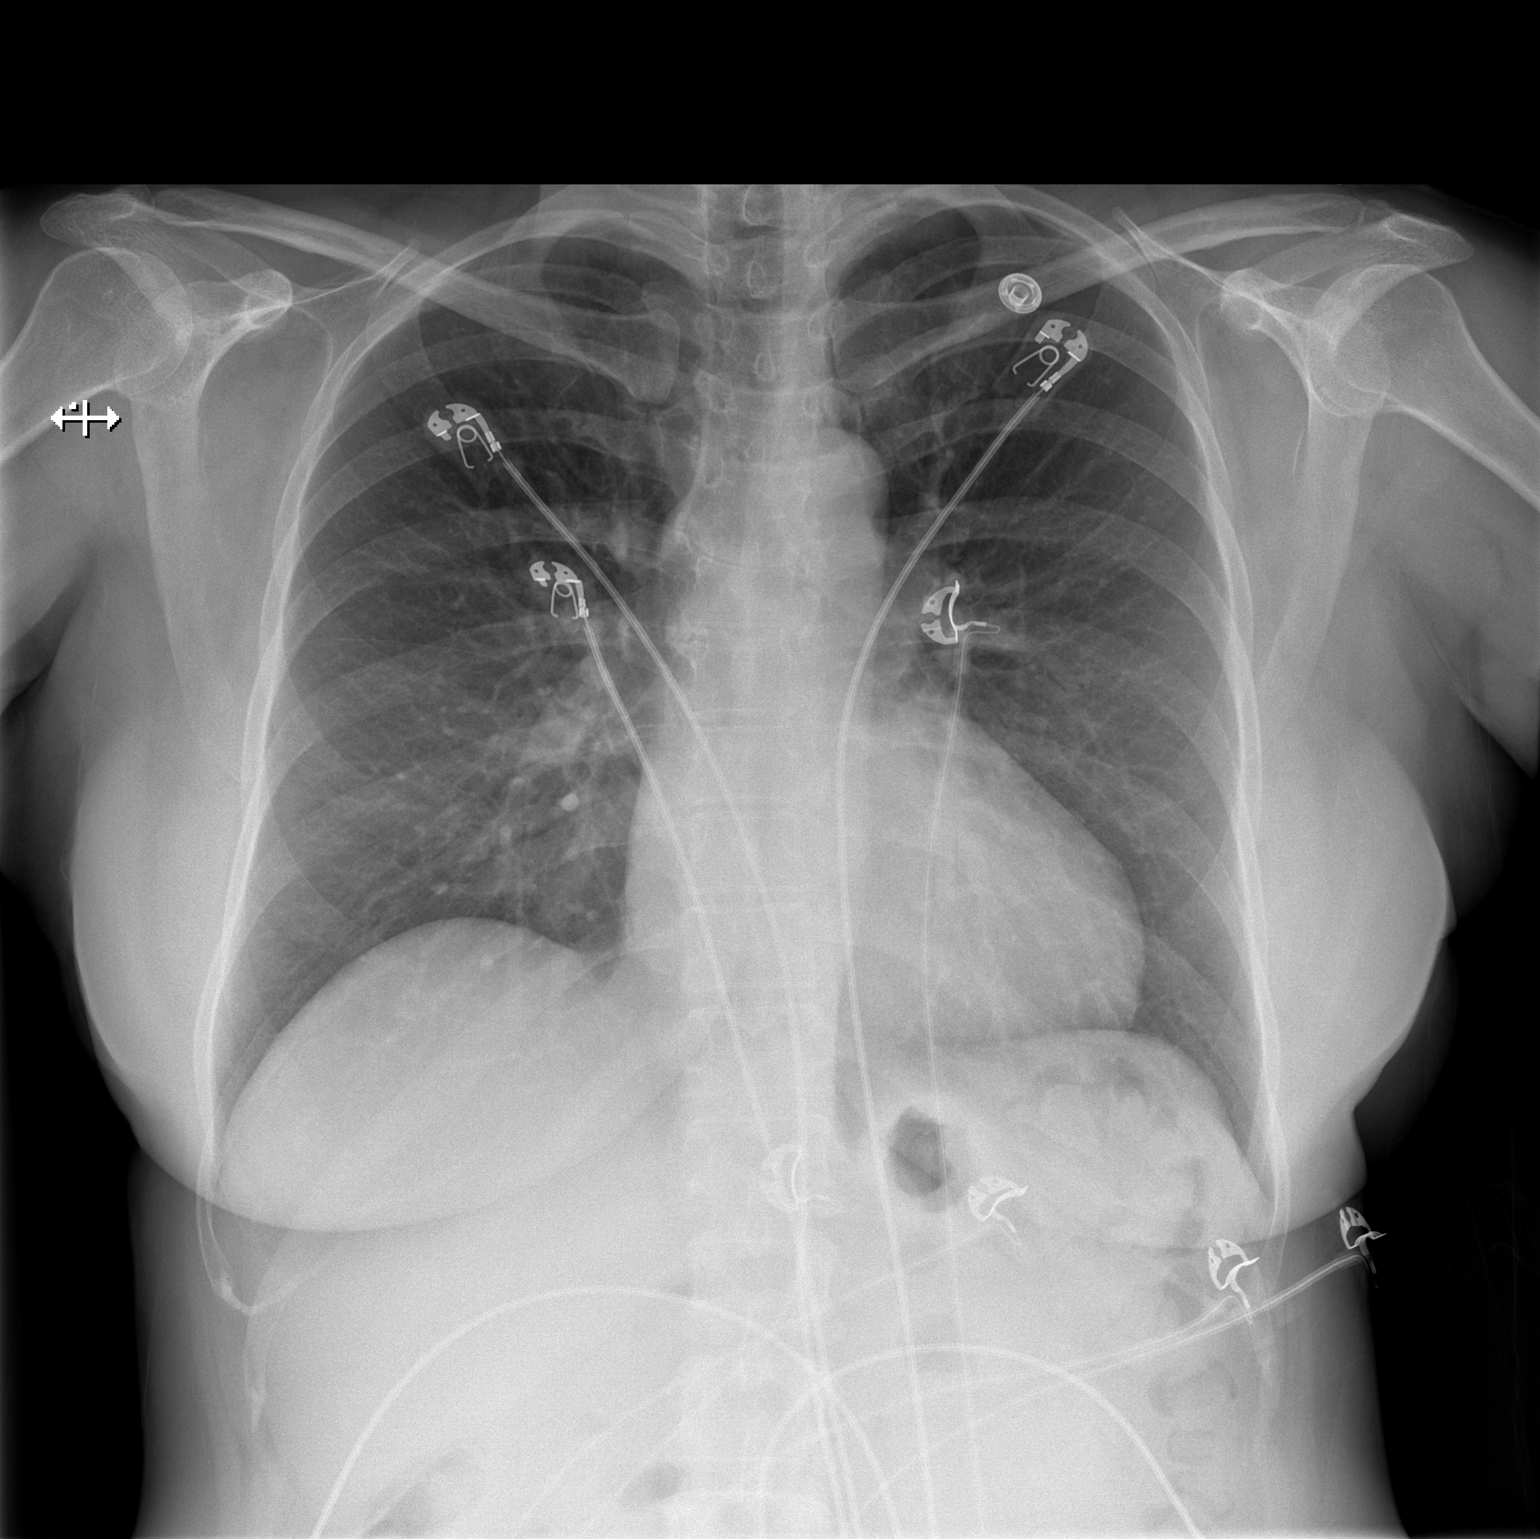

[w chest lat]
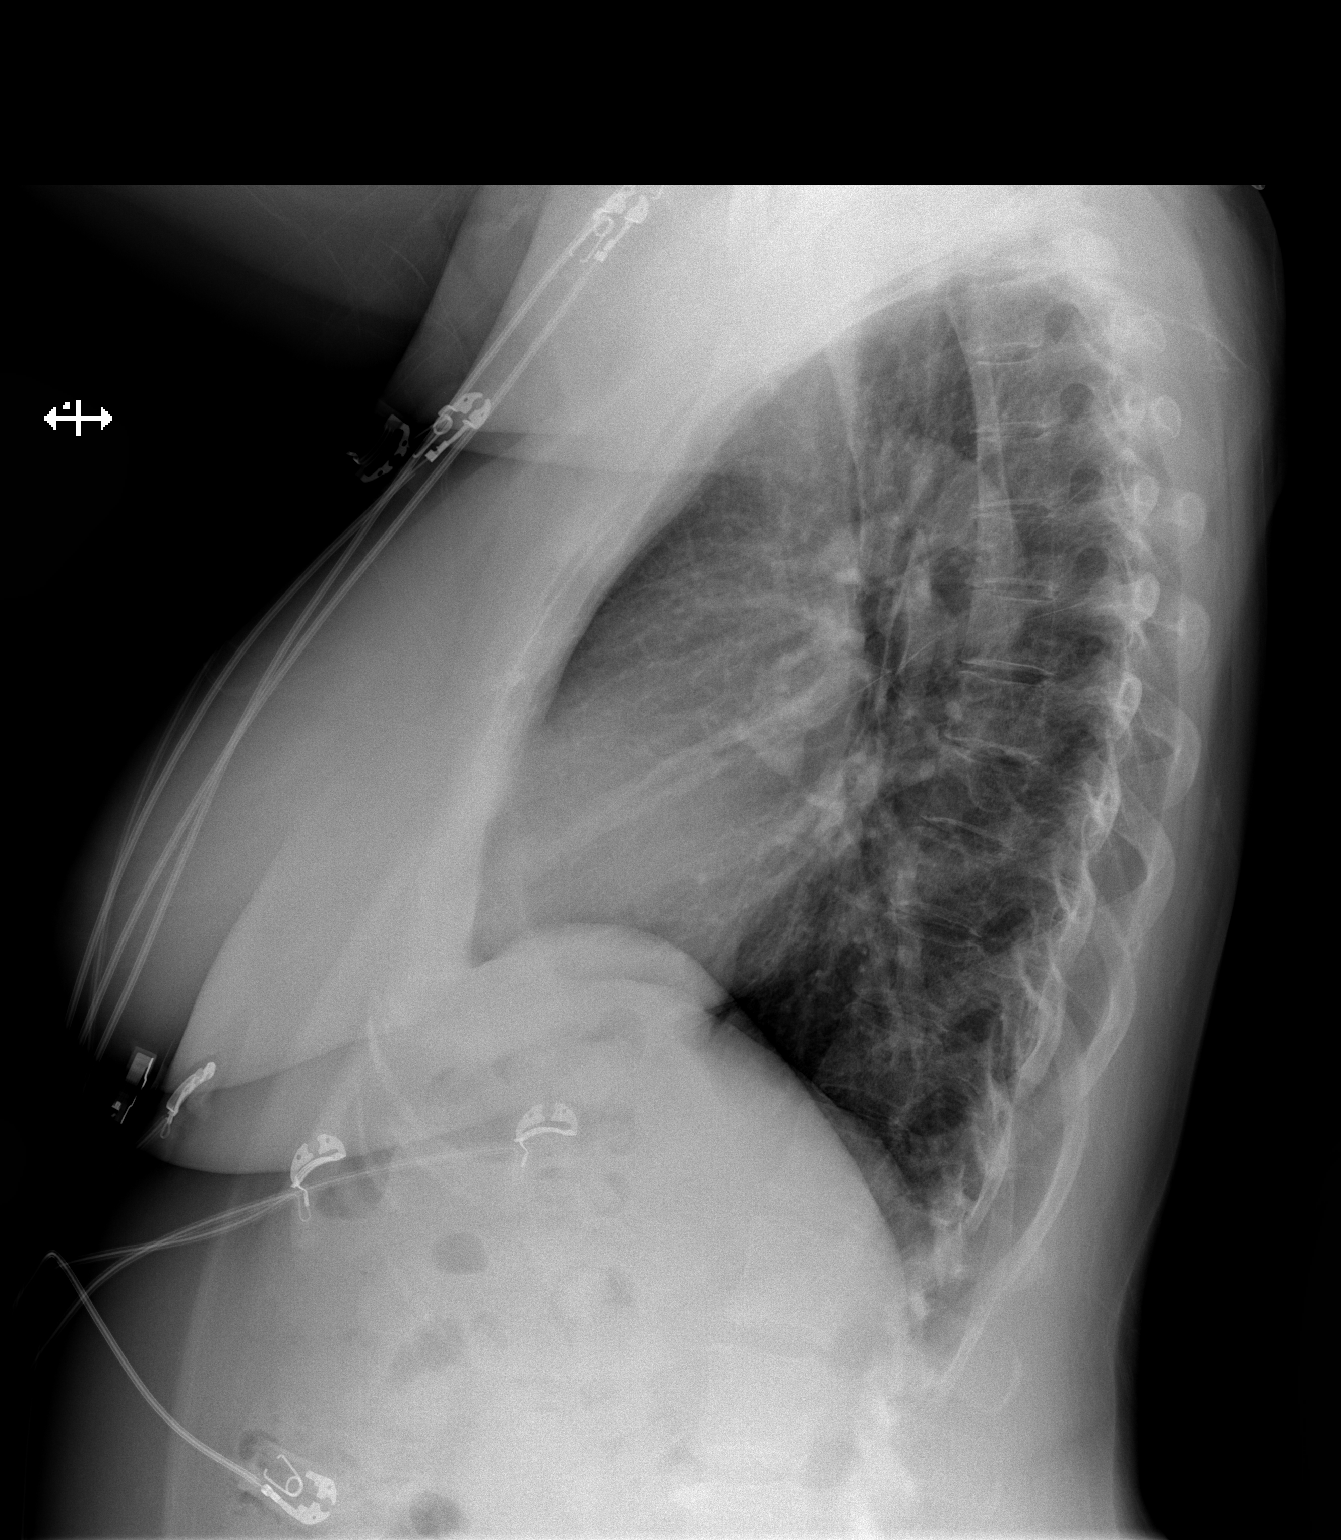

[2 of 2 positions shown; findings below may reference images not displayed]

FINDINGS: The heart size and mediastinal contours are within normal limits.
Both lungs are clear. The visualized skeletal structures are
unremarkable.
IMPRESSION: No active cardiopulmonary disease.

## 2023-12-24 ENCOUNTER — Encounter (HOSPITAL_COMMUNITY): Payer: Self-pay | Admitting: *Deleted

## 2023-12-24 ENCOUNTER — Observation Stay (HOSPITAL_COMMUNITY)

## 2023-12-24 ENCOUNTER — Other Ambulatory Visit: Payer: Self-pay

## 2023-12-24 ENCOUNTER — Observation Stay (HOSPITAL_COMMUNITY)
Admission: EM | Admit: 2023-12-24 | Discharge: 2023-12-25 | Disposition: A | Discharge status: Home / Self Care | Attending: Internal Medicine | Admitting: Internal Medicine

## 2023-12-24 ENCOUNTER — Emergency Department (HOSPITAL_COMMUNITY)

## 2023-12-24 DIAGNOSIS — G43909 Migraine, unspecified, not intractable, without status migrainosus: Secondary | ICD-10-CM | POA: Diagnosis not present

## 2023-12-24 DIAGNOSIS — I700 Atherosclerosis of aorta: Secondary | ICD-10-CM | POA: Diagnosis not present

## 2023-12-24 DIAGNOSIS — K5730 Diverticulosis of large intestine without perforation or abscess without bleeding: Secondary | ICD-10-CM | POA: Insufficient documentation

## 2023-12-24 DIAGNOSIS — E039 Hypothyroidism, unspecified: Secondary | ICD-10-CM | POA: Diagnosis not present

## 2023-12-24 DIAGNOSIS — Z79899 Other long term (current) drug therapy: Secondary | ICD-10-CM | POA: Diagnosis not present

## 2023-12-24 DIAGNOSIS — F411 Generalized anxiety disorder: Secondary | ICD-10-CM | POA: Diagnosis present

## 2023-12-24 DIAGNOSIS — E782 Mixed hyperlipidemia: Secondary | ICD-10-CM | POA: Diagnosis not present

## 2023-12-24 DIAGNOSIS — R079 Chest pain, unspecified: Principal | ICD-10-CM | POA: Diagnosis present

## 2023-12-24 DIAGNOSIS — K760 Fatty (change of) liver, not elsewhere classified: Secondary | ICD-10-CM | POA: Diagnosis not present

## 2023-12-24 DIAGNOSIS — F1010 Alcohol abuse, uncomplicated: Secondary | ICD-10-CM | POA: Diagnosis not present

## 2023-12-24 DIAGNOSIS — G43809 Other migraine, not intractable, without status migrainosus: Secondary | ICD-10-CM

## 2023-12-24 DIAGNOSIS — E785 Hyperlipidemia, unspecified: Secondary | ICD-10-CM | POA: Diagnosis present

## 2023-12-24 DIAGNOSIS — R7401 Elevation of levels of liver transaminase levels: Secondary | ICD-10-CM | POA: Diagnosis not present

## 2023-12-24 LAB — D-DIMER, QUANTITATIVE: D-Dimer, Quant: 0.89 ug{FEU}/mL — ABNORMAL HIGH (ref 0.00–0.50)

## 2023-12-24 LAB — CBC
HCT: 44.2 % (ref 36.0–46.0)
Hemoglobin: 14 g/dL (ref 12.0–15.0)
MCH: 30.5 pg (ref 26.0–34.0)
MCHC: 31.7 g/dL (ref 30.0–36.0)
MCV: 96.3 fL (ref 80.0–100.0)
Platelets: 271 K/uL (ref 150–400)
RBC: 4.59 MIL/uL (ref 3.87–5.11)
RDW: 14.1 % (ref 11.5–15.5)
WBC: 7.2 K/uL (ref 4.0–10.5)
nRBC: 0 % (ref 0.0–0.2)

## 2023-12-24 LAB — TSH: TSH: <0.1 u[IU]/mL — ABNORMAL LOW (ref 0.350–4.500)

## 2023-12-24 LAB — LIPASE, BLOOD: Lipase: 54 U/L — ABNORMAL HIGH (ref 11–51)

## 2023-12-24 LAB — BASIC METABOLIC PANEL WITH GFR
Anion gap: 12 (ref 5–15)
BUN: 7 mg/dL (ref 6–20)
CO2: 27 mmol/L (ref 22–32)
Calcium: 9.1 mg/dL (ref 8.9–10.3)
Chloride: 101 mmol/L (ref 98–111)
Creatinine, Ser: 0.57 mg/dL (ref 0.44–1.00)
GFR, Estimated: >60 mL/min (ref >60)
Glucose, Bld: 103 mg/dL — ABNORMAL HIGH (ref 70–99)
Potassium: 4.1 mmol/L (ref 3.5–5.1)
Sodium: 140 mmol/L (ref 135–145)

## 2023-12-24 LAB — PROTIME-INR
INR: 1 (ref 0.8–1.2)
Prothrombin Time: 13.5 s (ref 11.4–15.2)

## 2023-12-24 LAB — HEPATIC FUNCTION PANEL
ALT: 62 U/L — ABNORMAL HIGH (ref 0–44)
AST: 68 U/L — ABNORMAL HIGH (ref 15–41)
Albumin: 3.3 g/dL — ABNORMAL LOW (ref 3.5–5.0)
Alkaline Phosphatase: 77 U/L (ref 38–126)
Bilirubin, Direct: 0.1 mg/dL (ref 0.0–0.2)
Indirect Bilirubin: 0.6 mg/dL (ref 0.3–0.9)
Total Bilirubin: 0.7 mg/dL (ref 0.0–1.2)
Total Protein: 7 g/dL (ref 6.5–8.1)

## 2023-12-24 LAB — SEDIMENTATION RATE: Sed Rate: 30 mm/h — ABNORMAL HIGH (ref 0–22)

## 2023-12-24 LAB — C-REACTIVE PROTEIN: CRP: 0.6 mg/dL (ref <1.0)

## 2023-12-24 LAB — TROPONIN I (HIGH SENSITIVITY)
Troponin I (High Sensitivity): 3 ng/L (ref <18)
Troponin I (High Sensitivity): 3 ng/L (ref <18)
Troponin I (High Sensitivity): 3 ng/L (ref <18)

## 2023-12-24 LAB — ETHANOL: Alcohol, Ethyl (B): 64 mg/dL — ABNORMAL HIGH (ref <15)

## 2023-12-24 LAB — ECHOCARDIOGRAM COMPLETE
Area-P 1/2: 2.8 cm2
Height: 60 in
S' Lateral: 2.1 cm
Weight: 2176 [oz_av]

## 2023-12-24 LAB — MAGNESIUM: Magnesium: 1.9 mg/dL (ref 1.7–2.4)

## 2023-12-24 LAB — T4, FREE: Free T4: 1.93 ng/dL — ABNORMAL HIGH (ref 0.61–1.12)

## 2023-12-24 LAB — APTT: aPTT: 26 s (ref 24–36)

## 2023-12-24 LAB — GLUCOSE, CAPILLARY: Glucose-Capillary: 161 mg/dL — ABNORMAL HIGH (ref 70–99)

## 2023-12-24 MED ORDER — FENTANYL CITRATE PF 50 MCG/ML IJ SOSY: 25.0000 ug | PREFILLED_SYRINGE | INTRAMUSCULAR | Status: DC | PRN

## 2023-12-24 MED ORDER — ONDANSETRON HCL 4 MG/2ML IJ SOLN: 4.0000 mg | Freq: Four times a day (QID) | INTRAMUSCULAR | Status: DC | PRN

## 2023-12-24 MED ORDER — NALOXONE HCL 0.4 MG/ML IJ SOLN: 0.4000 mg | INTRAMUSCULAR | Status: DC | PRN

## 2023-12-24 MED ORDER — PANTOPRAZOLE SODIUM 40 MG IV SOLR
40.0000 mg | INTRAVENOUS | Status: DC
  Administered 2023-12-24 – 2023-12-25 (×2): 40 mg via INTRAVENOUS
  Filled 2023-12-24 (×2): qty 10

## 2023-12-24 MED ORDER — DIAZEPAM 5 MG/ML IJ SOLN: 5.0000 mg | Freq: Four times a day (QID) | INTRAMUSCULAR | Status: DC | PRN

## 2023-12-24 MED ORDER — THIAMINE HCL 100 MG/ML IJ SOLN
100.0000 mg | Freq: Once | INTRAMUSCULAR | Status: AC
  Administered 2023-12-24: 100 mg via INTRAVENOUS
  Filled 2023-12-24: qty 2

## 2023-12-24 MED ORDER — ASPIRIN 81 MG PO CHEW
162.0000 mg | CHEWABLE_TABLET | Freq: Once | ORAL | Status: AC
  Administered 2023-12-24: 162 mg via ORAL
  Filled 2023-12-24: qty 2

## 2023-12-24 MED ORDER — ALUM & MAG HYDROXIDE-SIMETH 200-200-20 MG/5ML PO SUSP
30.0000 mL | Freq: Once | ORAL | Status: AC
  Administered 2023-12-24: 30 mL via ORAL
  Filled 2023-12-24: qty 30

## 2023-12-24 MED ORDER — TOPIRAMATE 25 MG PO TABS
50.0000 mg | ORAL_TABLET | Freq: Every day | ORAL | Status: DC
Start: 2023-12-24 — End: 2023-12-25
  Filled 2023-12-24 (×2): qty 2

## 2023-12-24 MED ORDER — FOLIC ACID 1 MG PO TABS
1.0000 mg | ORAL_TABLET | Freq: Every day | ORAL | Status: DC
  Filled 2023-12-24: qty 1

## 2023-12-24 MED ORDER — LIDOCAINE HCL (PF) 1 % IJ SOLN
30.0000 mL | Freq: Once | INTRAMUSCULAR | Status: DC
  Filled 2023-12-24: qty 30

## 2023-12-24 MED ORDER — IOHEXOL 350 MG/ML SOLN
100.0000 mL | Freq: Once | INTRAVENOUS | Status: AC | PRN
  Administered 2023-12-24: 100 mL via INTRAVENOUS

## 2023-12-24 MED ORDER — ACETAMINOPHEN 325 MG PO TABS
650.0000 mg | ORAL_TABLET | Freq: Four times a day (QID) | ORAL | Status: DC | PRN
  Administered 2023-12-24 (×2): 650 mg via ORAL
  Filled 2023-12-24 (×2): qty 2

## 2023-12-24 MED ORDER — ESCITALOPRAM OXALATE 20 MG PO TABS
20.0000 mg | ORAL_TABLET | Freq: Every day | ORAL | Status: DC
  Filled 2023-12-24: qty 1

## 2023-12-24 MED ORDER — DIPHENHYDRAMINE HCL 25 MG PO CAPS: 25.0000 mg | ORAL_CAPSULE | Freq: Four times a day (QID) | ORAL | Status: DC | PRN

## 2023-12-24 MED ORDER — LIDOCAINE 5 % EX PTCH
1.0000 | MEDICATED_PATCH | CUTANEOUS | Status: DC
  Administered 2023-12-24: 1 via TRANSDERMAL
  Filled 2023-12-24: qty 1

## 2023-12-24 MED ORDER — ACETAMINOPHEN 650 MG RE SUPP: 650.0000 mg | Freq: Four times a day (QID) | RECTAL | Status: DC | PRN

## 2023-12-24 MED ORDER — LEVOTHYROXINE SODIUM 25 MCG PO TABS
137.0000 ug | ORAL_TABLET | Freq: Every day | ORAL | Status: DC
  Filled 2023-12-24: qty 1

## 2023-12-24 MED ORDER — MELATONIN 3 MG PO TABS: 3.0000 mg | ORAL_TABLET | Freq: Every evening | ORAL | Status: DC | PRN

## 2023-12-24 NOTE — Progress Notes (Signed)
  Echocardiogram 2D Echocardiogram has been performed.  Jill Olsen 12/24/2023, 9:42 AM

## 2023-12-24 NOTE — Plan of Care (Signed)
   Problem: Education: Goal: Knowledge of General Education information will improve Description: Including pain rating scale, medication(s)/side effects and non-pharmacologic comfort measures Outcome: Progressing   Problem: Clinical Measurements: Goal: Ability to maintain clinical measurements within normal limits will improve Outcome: Progressing Goal: Diagnostic test results will improve Outcome: Progressing

## 2023-12-24 NOTE — ED Triage Notes (Signed)
 Pt reports that her arms were hurting last night and today she has had some tightness under the breast that goes to the mid back. Pain in the right breast that radiates to the right shoulder pain. She says she has been burping more.She says that when she goes to drink something it feels like she cannot catch her breath.

## 2023-12-24 NOTE — Care Management (Signed)
  Transition of Care [facility]) Screening Note   Patient Details  Name: Jill Olsen Date of Birth: [birth year]   Transition of Care [facility]) CM/SW Contact:    Corean JAYSON Canary, RN Phone Number: 12/24/2023, 3:50 PM    Transition of Care Department [facility]) has reviewed patient and no TOC needs have been identified at this time. We will continue to monitor patient advancement through interdisciplinary progression rounds. If new patient transition needs arise, please place a TOC consult.

## 2023-12-24 NOTE — H&P (Signed)
 History and Physical      Jill Olsen FMW:[id number] DOB: 12/11/1965 DOA: 12/24/2023; DOS: 12/24/2023  PCP: Nichole Senior, MD  Patient coming from: home   I have personally briefly reviewed patient's old medical records in [facility]  Chief Complaint: Chest pain  HPI: Jill Olsen is a 58 y.o. female with medical history significant for acquired hypothyroidism, hyperlipidemia, erosive gastritis in 2023, GAD, who is admitted to [facility] on 12/24/2023 with chest pain after presenting from home to [facility] ED complaining of chest pain.   The patient reports 1 day of intermittent substernal right-sided chest pressure, radiating into the back, worse with exertion, improves with rest.  She reports that this chest pain is also reproducible with direct palpation over the right portion of the anterior chest wall.  She also notes exacerbation of the chest discomfort when leaning forward, and improvement laying supine.  Conveys that the chest pain has been nonpleuritic, denies any recent trauma.  She reports that these episodes of chest discomfort been associate with mild shortness of breath, but denies any associated nausea, vomiting no recent cough, hemoptysis, nor any recent subjective fever, chills, rigors, generalized myalgias.  No recent orthopnea, PND, or worsening of peripheral edema.  No recent lower extremity erythema or calf tenderness.  She has no known history of underlying coronary artery disease.  No prior echocardiogram on file per my initial chart review.  Her medical history is notable for hyperlipidemia as well as documentation of a diagnosis of erosive gastritis in 2023.  She also has a history of GAD and acquired hypothyroidism.  She reports daily consumption of alcohol, conveying that she typically consumes 1 to 212 ounce beers per day, with a recent increase in volume of daily consumption relative to this baseline.  She conveys that she is currently chest pain-free,  with most recent chest discomfort resolving spontaneously in the absence of any pharmacologic intervention.   ED Course:  Vital signs in the ED were notable for the following: Afebrile; rates in the 80s; systolic pressures in the 150s; respiratory rate 15, oxygen saturation 98% on room air.  Labs were notable for the following: CMP was notable for the following: Sodium 140, potassium 4.1, bicarbonate 27, BUN 7, creatinine 0.57103, alkaline phosphatase 77, total bilirubin 0.7, AST 68 compared to 22 in February 2023, ALT 62 compared to 17 in February 2023.  Lipase is currently pending.  Magnesium level 1.9.  High sensitive troponin I x 2 values were both found to be 3.  D-dimer was 0.89.  CBC notable for white cell count 7200, hemoglobin 14.  Per my interpretation, EKG in ED demonstrated the following: Sinus rhythm with heart rate 87, normal intervals, no evidence of T wave or ST changes, including no evidence of ST elevation.  Imaging in the ED, per corresponding formal radiology read, was notable for the following: 1 view chest x-ray showed no evidence of acute cardiopulmonary process, including no evidence of infiltrate, edema, effusion, or pneumothorax.  While in the ED, the following were administered: Maalox/Mylanta x 1 dose, aspirin 162 mg p.o. x 1 dose.  Subsequently, the patient was admitted for further evaluation management of presenting chest pain.     Review of Systems: As per HPI otherwise 10 point review of systems negative.   Past Medical History:  Diagnosis Date   Alcohol abuse    Anemia    Anxiety    Blood transfusion without reported diagnosis    Depression  Heart murmur    Heard in late 20s but not heard since   Hyperlipidemia    Hyperthyroidism    s/p radioactive iodine therapy. resolved.   Secondary hypothyroidism     Past Surgical History:  Procedure Laterality Date   BIOPSY  08/08/2020   Procedure: BIOPSY;  Surgeon: Eda Iha, MD;  Location: [facility]  ENDOSCOPY;  Service: Endoscopy;;   COLONOSCOPY WITH PROPOFOL  N/A 08/08/2020   Procedure: COLONOSCOPY WITH PROPOFOL ;  Surgeon: Eda Iha, MD;  Location: [facility] ENDOSCOPY;  Service: Endoscopy;  Laterality: N/A;   ECTOPIC PREGNANCY SURGERY     x2, one with salpingectomy, other with salpingostomy   ESOPHAGOGASTRODUODENOSCOPY (EGD) WITH PROPOFOL  N/A 08/08/2020   Procedure: ESOPHAGOGASTRODUODENOSCOPY (EGD) WITH PROPOFOL ;  Surgeon: Eda Iha, MD;  Location: [facility] ENDOSCOPY;  Service: Endoscopy;  Laterality: N/A;   HEMORRHOID SURGERY N/A 05/20/2021   Procedure: HEMORRHOIDECTOMY WITH LIGATION x 3 AND HEMORRHOIDOPEXY;  Surgeon: Sheldon Standing, MD;  Location: [facility] ORS;  Service: General;  Laterality: N/A;   LAPAROSCOPIC APPENDECTOMY N/A 02/12/2013   Procedure: APPENDECTOMY LAPAROSCOPIC;  Surgeon: Lynwood MALVA Pina, MD;  Location: [facility] OR;  Service: General;  Laterality: N/A;   RECTAL EXAM UNDER ANESTHESIA N/A 05/20/2021   Procedure: RECTAL EXAM UNDER ANESTHESIA;  Surgeon: Sheldon Standing, MD;  Location: [facility] ORS;  Service: General;  Laterality: N/A;   TUBAL LIGATION     WISDOM TOOTH EXTRACTION     XI ROBOTIC ASSISTED OOPHORECTOMY N/A 05/20/2021   Procedure: XI ROBOTIC ASSISTED UNILATERAL OOPHORECTOMY;  Surgeon: Viktoria Comer SAUNDERS, MD;  Location: [facility] ORS;  Service: Gynecology;  Laterality: N/A;   XI ROBOTIC ASSISTED SALPINGECTOMY Bilateral 05/20/2021   Procedure: XI ROBOTIC ASSISTED SALPINGECTOMY, ENDOMETRIAL BIOPSY;  Surgeon: Viktoria Comer SAUNDERS, MD;  Location: [facility] ORS;  Service: Gynecology;  Laterality: Bilateral;    Social History:  reports that she has quit smoking. Her smoking use included cigarettes. She has a 2.5 pack-year smoking history. She has never used smokeless tobacco. She reports current alcohol use of about 12.0 standard drinks of alcohol per week. She reports that she does not use drugs.   Allergies  Allergen Reactions   Codeine Itching    Lips numb   Dilaudid  [Hydromorphone  Hcl] Itching    Fentanyl  Itching   Levothyroxine  Other (See Comments)    Headaches Must use name brand Synthroid    Other     Dermabond    Family History  Problem Relation Age of Onset   Thyroid  disease Mother    Heart disease Mother    Colon polyps Father    Heart disease Father    Kidney disease Father    Breast cancer Sister    Crohn's disease Sister    Ovarian cancer Maternal Aunt    Kidney disease Maternal Uncle    Depression Maternal Grandfather    Glaucoma Maternal Grandfather    Prostate cancer Neg Hx    Endometrial cancer Neg Hx    Pancreatic cancer Neg Hx     Family history reviewed and not pertinent    Prior to Admission medications   Medication Sig Start Date End Date Taking? Authorizing Provider  diazepam  (VALIUM ) 5 MG tablet Take 1 tablet (5 mg total) by mouth every 8 (eight) hours as needed for muscle spasms (difficulty urinating). Patient not taking: Reported on 06/10/2021 05/20/21   Sheldon Standing, MD  DULoxetine  (CYMBALTA ) 60 MG capsule Take 60 mg by mouth daily.    [provider]  escitalopram  (LEXAPRO ) 10 MG tablet Take 10 mg by mouth daily.  07/25/20   [provider]  folic acid  (FOLVITE ) 800 MCG tablet Take 800-1,600 mcg by mouth See admin instructions. 800 mcg every other day alternating with 1,600 mcg every other day    [provider]  ibuprofen  (ADVIL ) 800 MG tablet Take 1 tablet (800 mg total) by mouth every 8 (eight) hours as needed for moderate pain. For AFTER surgery only Patient not taking: Reported on 06/10/2021 05/05/21   Micheline Setter D, NP  Lidocaine , Anorectal, (RECTICARE) 5 % CREA Apply topically.    [provider]  oxyCODONE  (OXY IR/ROXICODONE ) 5 MG immediate release tablet Take 1 tablet (5 mg total) by mouth every 6 (six) hours as needed for moderate pain, severe pain or breakthrough pain. Patient not taking: Reported on 06/10/2021 05/20/21   Sheldon Standing, MD  REPATHA SURECLICK 140 MG/ML SOAJ Inject 140 mg into the skin every  14 (fourteen) days. 07/25/20   [provider]  rosuvastatin  (CRESTOR ) 20 MG tablet Take 20 mg by mouth daily.    [provider]  senna-docusate (SENOKOT-S) 8.6-50 MG tablet Take 2 tablets by mouth at bedtime. For AFTER surgery, do not take if having diarrhea Patient not taking: Reported on 06/10/2021 05/05/21   Cross, Melissa D, NP  SYNTHROID  200 MCG tablet Take by mouth. 05/25/21   [provider]  traMADol  (ULTRAM ) 50 MG tablet Take 1 tablet (50 mg total) by mouth every 6 (six) hours as needed for severe pain. For AFTER surgery, do not take and drive Patient not taking: Reported on 06/10/2021 05/05/21   Micheline Setter D, NP  vitamin B-12 (CYANOCOBALAMIN) 1000 MCG tablet Take 2,000 mcg by mouth daily.    [provider]     Objective    Physical Exam: Vitals:   12/24/23 0135  BP: (!) 152/77  Pulse: 86  Resp: 15  Temp: 97.6 F (36.4 C)  TempSrc: Oral  SpO2: 98%    General: appears to be stated age; alert, oriented Skin: warm, dry, no rash Head:  AT/[place] Mouth:  Oral mucosa membranes appear moist, normal dentition Neck: supple; trachea midline Chest: Reproducibility of chest discomfort with palpation over the right anterior chest wall just to the right of midline Heart:  RRR; 2 out of 6 holosystolic murmur noted  Lungs: CTAB, did not appreciate any wheezes, rales, or rhonchi Abdomen: + BS; soft, ND, NT Vascular: 2+ pedal pulses b/l; 2+ radial pulses b/l Extremities: no peripheral edema, no muscle wasting    Labs on Admission: I have personally reviewed following labs and imaging studies  CBC: Recent Labs  Lab 12/24/23 0157  WBC 7.2  HGB 14.0  HCT 44.2  MCV 96.3  PLT 271   Basic Metabolic Panel: Recent Labs  Lab 12/24/23 0157  NA 140  K 4.1  CL 101  CO2 27  GLUCOSE 103*  BUN 7  CREATININE 0.57  CALCIUM  9.1   GFR: CrCl cannot be calculated (Unknown ideal weight.). Liver Function Tests: No results for input(s): AST, ALT,  ALKPHOS, BILITOT, PROT, ALBUMIN in the last 168 hours. No results for input(s): LIPASE, AMYLASE in the last 168 hours. No results for input(s): AMMONIA in the last 168 hours. Coagulation Profile: No results for input(s): INR, PROTIME in the last 168 hours. Cardiac Enzymes: No results for input(s): CKTOTAL, CKMB, CKMBINDEX, TROPONINI in the last 168 hours. BNP (last 3 results) No results for input(s): PROBNP in the last 8760 hours. HbA1C: No results for input(s): HGBA1C in the last 72 hours. CBG: No results  for input(s): GLUCAP in the last 168 hours. Lipid Profile: No results for input(s): CHOL, HDL, LDLCALC, TRIG, CHOLHDL, LDLDIRECT in the last 72 hours. Thyroid  Function Tests: No results for input(s): TSH, T4TOTAL, FREET4, T3FREE, THYROIDAB in the last 72 hours. Anemia Panel: No results for input(s): VITAMINB12, FOLATE, FERRITIN, TIBC, IRON , RETICCTPCT in the last 72 hours. Urine analysis:    Component Value Date/Time   COLORURINE YELLOW 05/05/2013 1957   APPEARANCEUR CLEAR 05/05/2013 1957   LABSPEC 1.010 05/05/2013 1957   PHURINE 5.5 05/05/2013 1957   GLUCOSEU NEGATIVE 05/05/2013 1957   HGBUR NEGATIVE 05/05/2013 1957   BILIRUBINUR NEGATIVE 05/05/2013 1957   KETONESUR NEGATIVE 05/05/2013 1957   PROTEINUR NEGATIVE 05/05/2013 1957   UROBILINOGEN 0.2 05/05/2013 1957   NITRITE NEGATIVE 05/05/2013 1957   LEUKOCYTESUR NEGATIVE 05/05/2013 1957    Radiological Exams on Admission: DG Chest Port 1 View Result Date: 12/24/2023 EXAM: 1 VIEW(S) XRAY OF THE CHEST 12/24/2023 02:02:00 AM COMPARISON: 02/16/2021. CLINICAL HISTORY: Chest pain [phone]. Cp rt arm pain. FINDINGS: LUNGS AND PLEURA: No focal pulmonary opacity. No pulmonary edema. No pleural effusion. No pneumothorax. HEART AND MEDIASTINUM: No acute abnormality of the cardiac and mediastinal silhouettes. BONES AND SOFT TISSUES: No acute osseous abnormality. IMPRESSION: 1.  No acute cardiopulmonary process. Electronically signed by: Franky Stanford MD 12/24/2023 03:16 AM EDT RP Workstation: [id number]      Assessment/Plan   Principal Problem:   Chest pain Active Problems:   HLD (hyperlipidemia)   Transaminitis   Acquired hypothyroidism   GAD (generalized anxiety disorder)   Migraines      #) Chest Pain: 1 day of intermittent substernal and right-sided chest wall pressure has been exertional, improving with rest, but also reproducible with direct palpation over the right portion of the anterior chest wall, with radiation to the back.  Chest pain has been positional, worsening with leaning forward and improving when lying supine, which appears to be the reciprocal of pain that would be anticipated with acute pericarditis.  Overall, her chest discomfort appears less suggestive of ACS.  However, given the presence of some typical characteristics in this patient with multiple CAD risk factors, no recent cardiac ischemic evaluation and presenting with heart score of 4, a decision was made to admit patient for overnight observation in order to further evaluate her chest discomfort and rule out ACS.  Of note, troponin x 2 are nonelevated and flat, EKG shows no evidence of acute ischemic changes, including evidence of STEMI.  Chest x-ray shows no evidence of acute cardiopulmonary process, including no evidence of pneumothorax.  Currently chest pain-free.  Should patient subsequently rule-out for ACS, can then consider possibility of discussing case with cardiology for assistance in determining the necessity/nature of additional ischemic evaluation in the context of patient's age, gender, and risk factors.   In the context of radiation of her chest discomfort in the back and elevated D-dimer, will check CTA chest, abdomen, pelvis with dissection protocol to evaluate for any contribution from aortic dissection.   In the context of her daily alcohol consumption, differential  also includes esophagitis, gastritis, peptic ulcer disease, pancreatitis.  Is currently pending.  She does have a documented history of erosive gastritis in 2023.  Will follow-up for result of lipase, further trend liver enzymes, IV Protonix .  Plan: trend serial troponin. Monitor on telemetry.  prn fentanyl  . PRN EKG for subsequent episodes of chest pain.  Follow-up for result of lipase.  CMP in the morning.  Repeat CBC in the AM.  CTA chest, abdomen, pelvis with dissection protocol, as above.  Check ESR, CRP.  Echocardiogram.  Prn IV Zofran , Protonix  40 mg IV daily.  Check urinary drug screen, serum ethanol level.                       #) Transaminitis: New mild elevation in AST and ALT with AST predominance, with presenting AST 68 relative to ALT 62.  This appears to represent a new elevation in her liver enzymes relative to most recent prior liver enzymes checked 2-1/2 years ago in February 2023.  No evidence of elevation in alkaline phosphatase or total bilirubin to suggest a cholestatic pattern.  No evidence of abdominal tenderness on exam.  Given 30+ month gap in data points, this mild elevation is chronic in nature and representative for daily alcohol consumption.  Will follow for result of previously described CTA chest, abdomen, pelvis, as above.  Plan: Follow-up result of CTA chest, abdomen, pelvis, as above.  Follow-up result lipase.  Repeat CMP in the morning.  Check urine drug screen, add on serum ethanol level.  Check INR, PTT hold home Crestor  for now.                     #) acquired hypothyroidism: documented h/o such following treatment of her previous hyperthyroidism with radioactive iodine.  On Synthroid  as an outpatient.  Plan: cont home Synthroid .  Check TSH level.                       #) Hyperlipidemia: documented h/o such. On rosuvastatin  as outpatient.   Plan: In the setting of new finding of transaminitis, will hold  next dose of rosuvastatin  for now.  Repeat CMP in the morning.                     #) Generalized anxiety disorder: documented h/o such. On Lexapro  as outpatient.    Plan: Continue outpatient Lexapro .  As needed IV Valium  for anxiety.  Follow-up for result of TSH level, as above.                     #) History of migraines: On prophylactic Topamax as an outpatient.  No evidence of anion gap metabolic acidosis on presenting CMP.  Plan: Resume outpatient Topamax.      DVT prophylaxis: SCD's   Code Status: Full code Family Communication: none Disposition Plan: Per Rounding Team Consults called: none;  Admission status: Observation     I SPENT GREATER THAN 75  MINUTES IN CLINICAL CARE TIME/MEDICAL DECISION-MAKING IN COMPLETING THIS ADMISSION.      Ailish Prospero B Miyo Aina DO [facility]  From 7PM - 7AM   12/24/2023, 4:10 AM

## 2023-12-24 NOTE — Progress Notes (Addendum)
 Brief same day note:  Patient is a 58 year old female with history of hypothyroidism, hyperlipidemia ,erosive  gastritis, GAD who presented with complaint of chest pain.  Chest pain was right-sided, described as chest pressure, radiating to the back, worse with exertion, improved with rest.  Chest pain found to be reproducible with direct palpation over the right portion of the anterior chest wall.  No history of coronary artery disease.  Report of consuming 1-2 beers per day.  On presentation, she was mildly hypertensive.  Lab work showed AST of 68, ALT of 62.  Troponins were normal.  D-dimer was 0.8.  EKG showed normal sinus rhythm, no evidence of ST changes.  Patient admitted for further workup.  Patient seen and examined at bedside today.  She just had echocardiogram and CT angio.  Right-sided chest pain is better.  She was eating her food.  Does not look any current distress.  On room air.  Assessment and plan:  Chest pain: 1 day history of intermittent substernal/right-sided chest wall pressure.  Chest pain reproducible on palpation of right side of the chest.  Low suspicious for acute coronary syndrome. Atypical chest pain.  Troponins are normal.  EKG not show any ischemic changes.  Chest x-ray does not show any acute cardiopulmonary process.  Currently chest pain-free.  Echo /CT angio chest/abdomen/pelvis pending pending. Mildly elevated D-dimer Continue Protonix   Chronic alcohol abuse/elevated lipase, liver enzymes: Likely associate with alcohol consumption.  Counseled for cessation.  Continue thiamine  and folic acid .  She drinks about 10 to 12 cans of 12 ounce beer every day.  Monitor for withdrawal.  Hypothyroidism: Take Synthyroid at home.  TSH is less than 0.1,, free T4 is slightly elevated.  Will adjust the dose  Hyperlipidemia: On rosuvastatin  as outpatient  GAD: On Lexapro  at home.  History of migraine: Takes Topomax as outpatient

## 2023-12-24 NOTE — ED Provider Notes (Signed)
 [place] EMERGENCY DEPARTMENT AT [facility] Provider Note   CSN: [id number] Arrival date & time: 12/24/23  9870     Patient presents with: Chest Pain   Jill Olsen is a 58 y.o. female.   Patient with a history of alcohol abuse, hyperthyroidism, hyperlipidemia, anemia presents with chest pain and arm pain.  States symptoms started the evening of October 2.  She had some discomfort to her left arm and then her right arm it lasted for several hours that seemed to resolve.  Then she developed some pain to the center of her chest with a pressure that radiates to her mid back.  It is fairly constant but worse when she changes position and sits up and walks around.  Does feel somewhat short of breath.  Has noticed increased burping.  No nausea or vomiting.   did have some diaphoresis but none currently.  States the pain has been fairly constant but waxes and wanes in severity and became increasingly tight tonight to the point that EMS was called.  Denies any known cardiac history.  Does not think she has ever had a stress test but it had cardiac clearance for surgery several years ago. Still has gallbladder but no appendix.  No recent black or bloody stools.  The history is provided by the patient.  Chest Pain Associated symptoms: nausea and shortness of breath   Associated symptoms: no dizziness, no fever, no headache, no vomiting and no weakness        Prior to Admission medications   Medication Sig Start Date End Date Taking? Authorizing Provider  diazepam  (VALIUM ) 5 MG tablet Take 1 tablet (5 mg total) by mouth every 8 (eight) hours as needed for muscle spasms (difficulty urinating). Patient not taking: Reported on 06/10/2021 05/20/21   Sheldon Standing, MD  DULoxetine  (CYMBALTA ) 60 MG capsule Take 60 mg by mouth daily.    [provider]  escitalopram  (LEXAPRO ) 10 MG tablet Take 10 mg by mouth daily. 07/25/20   [provider]  folic acid  (FOLVITE ) 800 MCG tablet  Take 800-1,600 mcg by mouth See admin instructions. 800 mcg every other day alternating with 1,600 mcg every other day    [provider]  ibuprofen  (ADVIL ) 800 MG tablet Take 1 tablet (800 mg total) by mouth every 8 (eight) hours as needed for moderate pain. For AFTER surgery only Patient not taking: Reported on 06/10/2021 05/05/21   Micheline Setter D, NP  Lidocaine , Anorectal, (RECTICARE) 5 % CREA Apply topically.    [provider]  oxyCODONE  (OXY IR/ROXICODONE ) 5 MG immediate release tablet Take 1 tablet (5 mg total) by mouth every 6 (six) hours as needed for moderate pain, severe pain or breakthrough pain. Patient not taking: Reported on 06/10/2021 05/20/21   Sheldon Standing, MD  REPATHA SURECLICK 140 MG/ML SOAJ Inject 140 mg into the skin every 14 (fourteen) days. 07/25/20   [provider]  rosuvastatin  (CRESTOR ) 20 MG tablet Take 20 mg by mouth daily.    [provider]  senna-docusate (SENOKOT-S) 8.6-50 MG tablet Take 2 tablets by mouth at bedtime. For AFTER surgery, do not take if having diarrhea Patient not taking: Reported on 06/10/2021 05/05/21   Cross, Melissa D, NP  SYNTHROID  200 MCG tablet Take by mouth. 05/25/21   [provider]  traMADol  (ULTRAM ) 50 MG tablet Take 1 tablet (50 mg total) by mouth every 6 (six) hours as needed for severe pain. For AFTER surgery, do not take and  drive Patient not taking: Reported on 06/10/2021 05/05/21   Cross, Melissa D, NP  vitamin B-12 (CYANOCOBALAMIN) 1000 MCG tablet Take 2,000 mcg by mouth daily.    [provider]    Allergies: Codeine, Dilaudid  [hydromorphone  hcl], Fentanyl , Levothyroxine , and Other    Review of Systems  Constitutional:  Negative for activity change, appetite change and fever.  HENT:  Negative for congestion and rhinorrhea.   Respiratory:  Positive for chest tightness and shortness of breath.   Cardiovascular:  Positive for chest pain.  Gastrointestinal:  Positive for nausea.  Negative for vomiting.  Genitourinary:  Negative for dysuria and hematuria.  Musculoskeletal:  Negative for arthralgias and myalgias.  Skin:  Negative for rash.  Neurological:  Negative for dizziness, weakness and headaches.   all other systems are negative except as noted in the HPI and PMH.    Updated Vital Signs BP (!) 152/77   Pulse 86   Temp 97.6 F (36.4 C) (Oral)   Resp 15   SpO2 98%   Physical Exam Vitals and nursing note reviewed.  Constitutional:      General: She is not in acute distress.    Appearance: She is well-developed. She is not ill-appearing.  HENT:     Head: Normocephalic and atraumatic.     Mouth/Throat:     Pharynx: No oropharyngeal exudate.  Eyes:     Conjunctiva/sclera: Conjunctivae normal.     Pupils: Pupils are equal, round, and reactive to light.  Neck:     Comments: No meningismus. Cardiovascular:     Rate and Rhythm: Normal rate and regular rhythm.     Heart sounds: Normal heart sounds. No murmur heard. Pulmonary:     Effort: Pulmonary effort is normal. No respiratory distress.     Breath sounds: Normal breath sounds.  Chest:     Chest wall: No tenderness.  Abdominal:     Palpations: Abdomen is soft.     Tenderness: There is no abdominal tenderness. There is no guarding or rebound.  Musculoskeletal:        General: No tenderness. Normal range of motion.     Cervical back: Normal range of motion and neck supple.  Skin:    General: Skin is warm.  Neurological:     Mental Status: She is alert and oriented to person, place, and time.     Cranial Nerves: No cranial nerve deficit.     Motor: No abnormal muscle tone.     Coordination: Coordination normal.     Comments:  5/5 strength throughout. CN 2-12 intact.Equal grip strength.   Psychiatric:        Behavior: Behavior normal.     (all labs ordered are listed, but only abnormal results are displayed) Labs Reviewed  BASIC METABOLIC PANEL WITH GFR - Abnormal; Notable for the following  components:      Result Value   Glucose, Bld 103 (*)    All other components within normal limits  D-DIMER, QUANTITATIVE - Abnormal; Notable for the following components:   D-Dimer, Quant 0.89 (*)    All other components within normal limits  HEPATIC FUNCTION PANEL - Abnormal; Notable for the following components:   Albumin 3.3 (*)    AST 68 (*)    ALT 62 (*)    All other components within normal limits  LIPASE, BLOOD - Abnormal; Notable for the following components:   Lipase 54 (*)    All other components within normal limits  ETHANOL - Abnormal; Notable for  the following components:   Alcohol, Ethyl (B) 64 (*)    All other components within normal limits  TSH - Abnormal; Notable for the following components:   TSH <0.100 (*)    All other components within normal limits  T4, FREE - Abnormal; Notable for the following components:   Free T4 1.93 (*)    All other components within normal limits  SEDIMENTATION RATE - Abnormal; Notable for the following components:   Sed Rate 30 (*)    All other components within normal limits  GLUCOSE, CAPILLARY - Abnormal; Notable for the following components:   Glucose-Capillary 161 (*)    All other components within normal limits  CBC  MAGNESIUM  APTT  PROTIME-INR  C-REACTIVE PROTEIN  RAPID URINE DRUG SCREEN, HOSP PERFORMED  T3, FREE  TROPONIN I (HIGH SENSITIVITY)  TROPONIN I (HIGH SENSITIVITY)  TROPONIN I (HIGH SENSITIVITY)    EKG: EKG Interpretation Date/Time:  Saturday December 24 2023 01:39:46 EDT Ventricular Rate:  87 PR Interval:  146 QRS Duration:  86 QT Interval:  380 QTC Calculation: 457 R Axis:   105  Text Interpretation: Normal sinus rhythm Rightward axis Cannot rule out Anterior infarct , age undetermined Abnormal ECG When compared with ECG of 16-Feb-2021 18:03, PREVIOUS ECG IS PRESENT No significant change was found Confirmed by Carita Senior [phone]) on 12/24/2023 2:54:41 AM  Radiology: [facility] Chest Port 1 View Result  Date: 12/24/2023 EXAM: 1 VIEW(S) XRAY OF THE CHEST 12/24/2023 02:02:00 AM COMPARISON: 02/16/2021. CLINICAL HISTORY: Chest pain [phone]. Cp rt arm pain. FINDINGS: LUNGS AND PLEURA: No focal pulmonary opacity. No pulmonary edema. No pleural effusion. No pneumothorax. HEART AND MEDIASTINUM: No acute abnormality of the cardiac and mediastinal silhouettes. BONES AND SOFT TISSUES: No acute osseous abnormality. IMPRESSION: 1. No acute cardiopulmonary process. Electronically signed by: Franky Stanford MD 12/24/2023 03:16 AM EDT RP Workstation: [id number]     Procedures   Medications Ordered in the ED  aspirin chewable tablet 162 mg (has no administration in time range)  alum & mag hydroxide-simeth (MAALOX/MYLANTA) 200-200-20 MG/5ML suspension 30 mL (has no administration in time range)  lidocaine  (PF) (XYLOCAINE ) 1 % injection 30 mL (has no administration in time range)                                    Medical Decision Making Amount and/or Complexity of Data Reviewed Labs: ordered. Decision-making details documented in ED Course. Radiology: ordered and independent interpretation performed. Decision-making details documented in ED Course. ECG/medicine tests: ordered and independent interpretation performed. Decision-making details documented in ED Course.  Risk OTC drugs. Prescription drug management. Decision regarding hospitalization.   Episode of chest tightness going to her back and both arms.  EKG is sinus rhythm without acute ST changes.  Pain has been ongoing for about 24 hours but waxing and waning in severity.  Denies cardiac history.  Does have a heart score of 4. ASA given. CXR negative.  Troponin negative.   History not consistent with pulmonary embolism or aortic dissection.  Pain seems rather atypical for ACS but does have cardiac risk factors and heart score of 4.  Pain somewhat worse with palpation. Also worse with leaning forward which is not typical of pericarditis.   Will  plan for observation admission for cardiac rule out. D/w Dr. Marcene.      Final diagnoses:  None    ED Discharge Orders     None  Carita Senior, MD 12/24/23 2147

## 2023-12-24 NOTE — Plan of Care (Signed)

## 2023-12-25 DIAGNOSIS — R079 Chest pain, unspecified: Secondary | ICD-10-CM | POA: Diagnosis not present

## 2023-12-25 MED ORDER — THIAMINE HCL 100 MG PO TABS: 100.0000 mg | ORAL_TABLET | Freq: Every day | ORAL | 0 refills | Status: AC

## 2023-12-25 MED ORDER — PANTOPRAZOLE SODIUM 40 MG PO TBEC: 40.0000 mg | DELAYED_RELEASE_TABLET | Freq: Every day | ORAL | 0 refills | Status: AC

## 2023-12-25 NOTE — Plan of Care (Signed)
   Problem: Education: Goal: Knowledge of General Education information will improve Description: Including pain rating scale, medication(s)/side effects and non-pharmacologic comfort measures Outcome: Progressing   Problem: Health Behavior/Discharge Planning: Goal: Ability to manage health-related needs will improve Outcome: Progressing   Problem: Clinical Measurements: Goal: Will remain free from infection Outcome: Progressing

## 2023-12-25 NOTE — Discharge Summary (Signed)
 Physician Discharge Summary  Jill Olsen FMW:[id number] DOB: [birth year] DOA: 12/24/2023  PCP: Jill Senior, MD  Admit date: 12/24/2023 Discharge date: 12/25/2023  Admitted From: Home Disposition:  Home  Discharge Condition:Stable CODE STATUS:FULL Diet recommendation:  Regular  Brief/Interim Summary: Patient is a 58 year old female with history of hypothyroidism, hyperlipidemia ,erosive  gastritis, GAD who presented with complaint of chest pain.  Chest pain was right-sided, described as chest pressure, radiating to the back, worse with exertion, improved with rest.  Chest pain found to be reproducible with direct palpation over the right portion of the anterior chest wall.  No history of coronary artery disease.  Report of consuming 10-12 beers per day.  On presentation, she was mildly hypertensive.  Lab work showed AST of 68, ALT of 62.  Troponins were normal.  D-dimer was 0.8.  EKG showed normal sinus rhythm, no evidence of ST changes.  Patient was admitted for further workup.  Her chest pain resolved during this hospitalization.  Echocardiogram showed preserved EF without wall motion abnormality.  CT angiogram chest/abdomen/pelvis without any significant acute findings.  Chest pain was most likely from musculoskeletal etiology or GERD.  She is medically stable for discharge home today.  Following problems were addressed during the hospitalization:  Chest pain: 1 day history of intermittent substernal/right-sided chest wall pressure.  Chest pain reproducible on palpation of right side of the chest.  Low suspicious for acute coronary syndrome.   Troponins are normal.  EKG not show any ischemic changes.  Chest x-ray does not show any acute cardiopulmonary process.  Currently chest pain-free .Echocardiogram showed preserved EF without wall motion abnormality.  CT angiogram chest/abdomen/pelvis without any significant acute findings.  Chest pain was most likely from musculoskeletal etiology or  GERD.  Started on Protonix    Chronic alcohol abuse/elevated lipase, liver enzymes:.  Counseled for cessation.  Continue thiamine  and folic acid .  She drinks about 10 to 12 cans of 12 ounce beer every day.  Currently she is not in withdrawal.  She has been motivated to quit alcohol.  Taking acamprosate   Hypothyroidism: Take Synthyroid at home.  TSH is less than 0.1,, free T4 is slightly elevated.  Will hold Synthyroid for now.  Patient has been recommended to check TFT in a week   Hyperlipidemia: On rosuvastatin  as outpatient   GAD: On Lexapro  at home.   History of migraine: Takes Topomax as outpatient  Abnormal CT imaging: Chest CTA incidentally showed 7 mm ground glass opacity on anterior side of middle lobe.  We recommended do a follow-up CT chest in 6 months      Discharge Diagnoses:  Principal Problem:   Chest pain Active Problems:   HLD (hyperlipidemia)   Transaminitis   Acquired hypothyroidism   GAD (generalized anxiety disorder)   Migraines    Discharge Instructions  Discharge Instructions     Diet general   Complete by: As directed    Discharge instructions   Complete by: As directed    1)Please take your medications as instructed 2)Follow up with your PCP next week 3)Quit alcohol 4)We found that your thyroid  hormone level is high( Low TSH,high free T4).  We are holding Synthyroid for now.  We recommend you to check your thyroid  hormone level in 4 weeks by following with your PCP.   Increase activity slowly   Complete by: As directed       Allergies as of 12/25/2023       Reactions   Codeine Itching   Lips  numb   Other Hives, Itching, Rash   Dermabond- makes her feel like her skin is crawling.    Levothyroxine  Other (See Comments)   Headaches Must use name brand Synthroid    Dilaudid  [hydromorphone  Hcl] Itching   Fentanyl  Itching        Medication List     STOP taking these medications    Synthroid  137 MCG tablet Generic drug: levothyroxine         TAKE these medications    acamprosate 333 MG tablet Commonly known as: CAMPRAL Take 666 mg by mouth 3 (three) times daily.   acetaminophen  500 MG tablet Commonly known as: TYLENOL  Take 1,000 mg by mouth every 6 (six) hours as needed for mild pain (pain score 1-3) or headache.   cyanocobalamin 1000 MCG tablet Commonly known as: VITAMIN B12 Take 1,000 mcg by mouth daily.   DULoxetine  60 MG capsule Commonly known as: CYMBALTA  Take 60 mg by mouth daily.   escitalopram  20 MG tablet Commonly known as: LEXAPRO  Take 20 mg by mouth daily.   folic acid  800 MCG tablet Commonly known as: FOLVITE  Take 2,400 mcg by mouth in the morning. Take 2400mcg by mouth daily in the morning.   pantoprazole  40 MG tablet Commonly known as: Protonix  Take 1 tablet (40 mg total) by mouth daily.   Repatha SureClick 140 MG/ML Soaj Generic drug: Evolocumab Inject 140 mg into the skin every 14 (fourteen) days.   rosuvastatin  20 MG tablet Commonly known as: CRESTOR  Take 20 mg by mouth daily.   thiamine  100 MG tablet Commonly known as: VITAMIN B1 Take 1 tablet (100 mg total) by mouth daily.   topiramate 50 MG tablet Commonly known as: TOPAMAX Take 50 mg by mouth daily.        Follow-up Information     Jill Senior, MD. Schedule an appointment as soon as possible for a visit in 1 week(s).   Specialty: Endocrinology Contact information: [address] [place] [address] [phone]                Allergies  Allergen Reactions   Codeine Itching    Lips numb   Other Hives, Itching and Rash    Dermabond- makes her feel like her skin is crawling.    Levothyroxine  Other (See Comments)    Headaches Must use name brand Synthroid    Dilaudid  [Hydromorphone  Hcl] Itching   Fentanyl  Itching    Consultations: None   Procedures/Studies: ECHOCARDIOGRAM COMPLETE Result Date: 12/24/2023    ECHOCARDIOGRAM REPORT   Patient Name:   Jill Olsen Date of Exam: 12/24/2023  Medical Rec #:  [id number]     Height:       60.0 in Accession #:    [id number]    Weight:       136.0 lb Date of Birth:  1965/10/29    BSA:          1.584 m Patient Age:    57 years      BP:           138/76 mmHg Patient Gender: F             HR:           89 bpm. Exam Location:  Inpatient Procedure: 2D Echo (Both Spectral and Color Flow Doppler were utilized during            procedure). Indications:    chest pain  History:        Patient has no prior history  of Echocardiogram examinations.                 Risk Factors:Dyslipidemia.  Sonographer:    Tinnie Barefoot RDCS Referring Phys: [id number] JUSTIN B HOWERTER IMPRESSIONS  1. Left ventricular ejection fraction, by estimation, is 70 to 75%. The left ventricle has hyperdynamic function. The left ventricle has no regional wall motion abnormalities. Left ventricular diastolic parameters are consistent with Grade I diastolic dysfunction (impaired relaxation).  2. Right ventricular systolic function is normal. The right ventricular size is normal. Tricuspid regurgitation signal is inadequate for assessing PA pressure.  3. The mitral valve is grossly normal. No evidence of mitral valve regurgitation. No evidence of mitral stenosis.  4. The aortic valve was not well visualized. Aortic valve regurgitation is not visualized. No aortic stenosis is present.  5. The inferior vena cava is normal in size with greater than 50% respiratory variability, suggesting right atrial pressure of 3 mmHg. Comparison(s): No prior Echocardiogram. FINDINGS  Left Ventricle: Left ventricular ejection fraction, by estimation, is 70 to 75%. The left ventricle has hyperdynamic function. The left ventricle has no regional wall motion abnormalities. Strain was performed and the global longitudinal strain is indeterminate. The left ventricular internal cavity size was normal in size. There is no left ventricular hypertrophy. Left ventricular diastolic parameters are consistent with Grade I  diastolic dysfunction (impaired relaxation). Right Ventricle: The right ventricular size is normal. No increase in right ventricular wall thickness. Right ventricular systolic function is normal. Tricuspid regurgitation signal is inadequate for assessing PA pressure. Left Atrium: Left atrial size was normal in size. Right Atrium: Right atrial size was normal in size. Pericardium: There is no evidence of pericardial effusion. Mitral Valve: The mitral valve is grossly normal. No evidence of mitral valve regurgitation. No evidence of mitral valve stenosis. Tricuspid Valve: The tricuspid valve is not well visualized. Tricuspid valve regurgitation is not demonstrated. No evidence of tricuspid stenosis. Aortic Valve: The aortic valve was not well visualized. Aortic valve regurgitation is not visualized. No aortic stenosis is present. Pulmonic Valve: The pulmonic valve was not well visualized. Pulmonic valve regurgitation is not visualized. No evidence of pulmonic stenosis. Aorta: The aortic root and ascending aorta are structurally normal, with no evidence of dilitation. Venous: The inferior vena cava is normal in size with greater than 50% respiratory variability, suggesting right atrial pressure of 3 mmHg. IAS/Shunts: No atrial level shunt detected by color flow Doppler. Additional Comments: 3D was performed not requiring image post processing on an independent workstation and was indeterminate.  LEFT VENTRICLE PLAX 2D LVIDd:         3.90 cm   Diastology LVIDs:         2.10 cm   LV e' medial:    8.92 cm/s LV PW:         0.80 cm   LV E/e' medial:  8.3 LV IVS:        0.60 cm   LV e' lateral:   7.40 cm/s LVOT diam:     1.60 cm   LV E/e' lateral: 10.0 LV SV:         46 LV SV Index:   29 LVOT Area:     2.01 cm  RIGHT VENTRICLE             IVC RV Basal diam:  2.20 cm     IVC diam: 1.20 cm RV S prime:     13.30 cm/s TAPSE (M-mode): 2.5 cm LEFT ATRIUM  Index        RIGHT ATRIUM          Index LA diam:        3.50 cm  2.21 cm/m   RA Area:     9.97 cm LA Vol (A2C):   28.6 ml 18.05 ml/m  RA Volume:   21.30 ml 13.44 ml/m LA Vol (A4C):   37.2 ml 23.48 ml/m LA Biplane Vol: 33.2 ml 20.95 ml/m  AORTIC VALVE LVOT Vmax:   104.00 cm/s LVOT Vmean:  71.700 cm/s LVOT VTI:    0.229 m  AORTA Ao Root diam: 2.20 cm Ao Asc diam:  2.30 cm MITRAL VALVE MV Area (PHT): 2.80 cm    SHUNTS MV Decel Time: 271 msec    Systemic VTI:  0.23 m MV E velocity: 73.70 cm/s  Systemic Diam: 1.60 cm MV A velocity: 91.30 cm/s MV E/A ratio:  0.81 Vishnu Priya Mallipeddi Electronically signed by Diannah Late Mallipeddi Signature Date/Time: 12/24/2023/1:07:44 PM    Final    CT Angio Chest/Abd/Pel for Dissection W and/or W/WO Result Date: 12/24/2023 CLINICAL DATA:  Chest pain radiates to the back. Acute aortic syndrome suspected. EXAM: CT ANGIOGRAPHY CHEST, ABDOMEN AND PELVIS TECHNIQUE: Non-contrast CT of the chest was initially obtained. Multidetector CT imaging through the chest, abdomen and pelvis was performed using the standard protocol during bolus administration of intravenous contrast. Multiplanar reconstructed images and MIPs were obtained and reviewed to evaluate the vascular anatomy. RADIATION DOSE REDUCTION: This exam was performed according to the departmental dose-optimization program which includes automated exposure control, adjustment of the mA and/or kV according to patient size and/or use of iterative reconstruction technique. CONTRAST:  OMNIPAQUE  IOHEXOL  350 MG/ML SOLN COMPARISON:  Abdomen and pelvis CT 05/05/2013. FINDINGS: CTA CHEST FINDINGS Cardiovascular: The heart size is normal. No substantial pericardial effusion. Coronary artery calcification is evident. Mild atherosclerotic calcification is noted in the wall of the thoracic aorta. Pre contrast imaging shows no hyperdense crescent in the wall of the thoracic aorta to suggest the presence of an acute intramural hematoma. No thoracic aortic aneurysm. No dissection of the thoracic  aorta. Mediastinum/Nodes: No mediastinal lymphadenopathy. There is no hilar lymphadenopathy. The esophagus has normal imaging features. There is no axillary lymphadenopathy. Lungs/Pleura: 2 mm left upper lobe pulmonary nodule identified on 42/7. 2 mm right upper lobe nodule seen on 54/7. 7 mm ground-glass opacity anterior right middle lobe on 90/7. No focal airspace consolidation. No pulmonary edema or pleural effusion. Musculoskeletal: No worrisome lytic or sclerotic osseous abnormality. Review of the MIP images confirms the above findings. CTA ABDOMEN AND PELVIS FINDINGS VASCULAR Aorta: Insert aorta Celiac: Patent without evidence of aneurysm, dissection, vasculitis or significant stenosis. SMA: Patent without evidence of aneurysm, dissection, vasculitis or significant stenosis. Renals: Both renal arteries are patent without evidence of aneurysm, dissection, vasculitis, fibromuscular dysplasia or significant stenosis. IMA: Patent without evidence of aneurysm, dissection, vasculitis or significant stenosis. Inflow: Patent without evidence of aneurysm, dissection, vasculitis or significant stenosis. Veins: No obvious venous abnormality within the limitations of this arterial phase study. Review of the MIP images confirms the above findings. NON-VASCULAR Hepatobiliary: The liver shows diffusely decreased attenuation suggesting fat deposition. No suspicious focal abnormality within the liver parenchyma. There is no evidence for gallstones, gallbladder wall thickening, or pericholecystic fluid. No intrahepatic or extrahepatic biliary dilation. Pancreas: No focal mass lesion. No dilatation of the main duct. No intraparenchymal cyst. No peripancreatic edema. Spleen: No splenomegaly. No suspicious focal mass lesion. Adrenals/Urinary Tract: No adrenal  nodule or mass. Kidneys unremarkable. No evidence for hydroureter. Bladder is underdistended which accentuates wall thickness. Stomach/Bowel: Stomach is unremarkable. No  gastric wall thickening. No evidence of outlet obstruction. Duodenum is normally positioned as is the ligament of Treitz. No small bowel wall thickening. No small bowel dilatation. The terminal ileum is normal. The appendix is not well visualized, but there is no edema or inflammation in the region of the cecal tip to suggest appendicitis. No gross colonic mass. No colonic wall thickening. Diverticular changes are noted in the left colon without evidence of diverticulitis. Lymphatic: There is no gastrohepatic or hepatoduodenal ligament lymphadenopathy. No retroperitoneal or mesenteric lymphadenopathy. No pelvic sidewall lymphadenopathy. Reproductive: The uterus is unremarkable.  There is no adnexal mass. Other: No intraperitoneal free fluid. Musculoskeletal: No worrisome lytic or sclerotic osseous abnormality. Gas bubbles in the anterior subcutaneous fat of the low anterior right abdominal wall is likely secondary to an injection. Review of the MIP images confirms the above findings. IMPRESSION: 1. No evidence for acute intramural hematoma or dissection of the thoracoabdominal aorta. No thoracoabdominal aortic aneurysm. 2. 7 mm ground-glass opacity anterior right middle lobe. Initial follow-up with CT at 6 months is recommended to confirm persistence. If persistent, repeat CT is recommended every 2 years until 5 years of stability has been established. This recommendation follows the consensus statement: Guidelines for Management of Incidental Pulmonary Nodules Detected on CT Images: From the Fleischner Society 2017; Radiology 2017; 284:228-243. 3. Additional tiny upper lobe pulmonary nodules. Attention on follow-up recommended. 4. Hepatic steatosis. 5. Left colonic diverticulosis without diverticulitis. 6. Mild circumferential bladder wall thickening may be related to underdistention but correlation with urinalysis recommended to exclude infection. 7.  Aortic Atherosclerosis (ICD10-I70.0). Electronically Signed    By: Camellia Candle M.D.   On: 12/24/2023 13:03   DG Chest Port 1 View Result Date: 12/24/2023 EXAM: 1 VIEW(S) XRAY OF THE CHEST 12/24/2023 02:02:00 AM COMPARISON: 02/16/2021. CLINICAL HISTORY: Chest pain [phone]. Cp rt arm pain. FINDINGS: LUNGS AND PLEURA: No focal pulmonary opacity. No pulmonary edema. No pleural effusion. No pneumothorax. HEART AND MEDIASTINUM: No acute abnormality of the cardiac and mediastinal silhouettes. BONES AND SOFT TISSUES: No acute osseous abnormality. IMPRESSION: 1. No acute cardiopulmonary process. Electronically signed by: Franky Stanford MD 12/24/2023 03:16 AM EDT RP Workstation: [id number]      Subjective: Patient seen and examined at bedside today.  Hemodynamically stable.  No chest pain today.  Feels ready to go home.  Discharge plan discussed with husband at bedside.  Discharge Exam: Vitals:   12/25/23 0438 12/25/23 0807  BP: 129/89 137/81  Pulse: 90 82  Resp: 17 18  Temp: 98.1 F (36.7 C) 98.2 F (36.8 C)  SpO2: 98% 97%   Vitals:   12/24/23 1724 12/24/23 1953 12/25/23 0438 12/25/23 0807  BP: (!) 153/81 133/75 129/89 137/81  Pulse: 84 92 90 82  Resp: 18  17 18   Temp: 98.3 F (36.8 C) 98.4 F (36.9 C) 98.1 F (36.7 C) 98.2 F (36.8 C)  TempSrc: Oral Oral Oral Oral  SpO2: 98% 99% 98% 97%  Weight:      Height:        General: Pt is alert, awake, not in acute distress Cardiovascular: RRR, S1/S2 +, no rubs, no gallops Respiratory: CTA bilaterally, no wheezing, no rhonchi Abdominal: Soft, NT, ND, bowel sounds + Extremities: no edema, no cyanosis    The results of significant diagnostics from this hospitalization (including imaging, microbiology, ancillary and laboratory) are listed below for reference.  Microbiology: No results found for this or any previous visit (from the past 240 hours).   Labs: BNP (last 3 results) No results for input(s): BNP in the last 8760 hours. Basic Metabolic Panel: Recent Labs  Lab 12/24/23 0157  12/24/23 0356  NA 140  --   K 4.1  --   CL 101  --   CO2 27  --   GLUCOSE 103*  --   BUN 7  --   CREATININE 0.57  --   CALCIUM  9.1  --   MG  --  1.9   Liver Function Tests: Recent Labs  Lab 12/24/23 0356  AST 68*  ALT 62*  ALKPHOS 77  BILITOT 0.7  PROT 7.0  ALBUMIN 3.3*   Recent Labs  Lab 12/24/23 0356  LIPASE 54*   No results for input(s): AMMONIA in the last 168 hours. CBC: Recent Labs  Lab 12/24/23 0157  WBC 7.2  HGB 14.0  HCT 44.2  MCV 96.3  PLT 271   Cardiac Enzymes: No results for input(s): CKTOTAL, CKMB, CKMBINDEX, TROPONINI in the last 168 hours. BNP: Invalid input(s): POCBNP CBG: Recent Labs  Lab 12/24/23 1953  GLUCAP 161*   D-Dimer Recent Labs    12/24/23 0356  DDIMER 0.89*   Hgb A1c No results for input(s): HGBA1C in the last 72 hours. Lipid Profile No results for input(s): CHOL, HDL, LDLCALC, TRIG, CHOLHDL, LDLDIRECT in the last 72 hours. Thyroid  function studies Recent Labs    12/24/23 0157  TSH <0.100*   Anemia work up No results for input(s): VITAMINB12, FOLATE, FERRITIN, TIBC, IRON , RETICCTPCT in the last 72 hours. Urinalysis    Component Value Date/Time   COLORURINE YELLOW 05/05/2013 1957   APPEARANCEUR CLEAR 05/05/2013 1957   LABSPEC 1.010 05/05/2013 1957   PHURINE 5.5 05/05/2013 1957   GLUCOSEU NEGATIVE 05/05/2013 1957   HGBUR NEGATIVE 05/05/2013 1957   BILIRUBINUR NEGATIVE 05/05/2013 1957   KETONESUR NEGATIVE 05/05/2013 1957   PROTEINUR NEGATIVE 05/05/2013 1957   UROBILINOGEN 0.2 05/05/2013 1957   NITRITE NEGATIVE 05/05/2013 1957   LEUKOCYTESUR NEGATIVE 05/05/2013 1957   Sepsis Labs Recent Labs  Lab 12/24/23 0157  WBC 7.2   Microbiology No results found for this or any previous visit (from the past 240 hours).  Please note: You were cared for by a hospitalist during your hospital stay. Once you are discharged, your primary care physician will handle any further  medical issues. Please note that NO REFILLS for any discharge medications will be authorized once you are discharged, as it is imperative that you return to your primary care physician (or establish a relationship with a primary care physician if you do not have one) for your post hospital discharge needs so that they can reassess your need for medications and monitor your lab values.    Time coordinating discharge: 40 minutes  SIGNED:   Ivonne Mustache, MD  [facility] 12/25/2023, 10:56 AM Pager [phone]  If 7PM-7AM, please contact night-coverage [email or web address] Password [email or web address]

## 2024-01-02 ENCOUNTER — Other Ambulatory Visit: Payer: Self-pay | Admitting: Registered Nurse

## 2024-01-02 DIAGNOSIS — R0789 Other chest pain: Secondary | ICD-10-CM

## 2024-01-02 DIAGNOSIS — R9389 Abnormal findings on diagnostic imaging of other specified body structures: Secondary | ICD-10-CM

## 2024-01-12 LAB — T3, FREE: T3, Free: 4.2 pg/mL
